# Patient Record
Sex: Female | Born: 1950 | Hispanic: No | Marital: Married | Smoking: Never smoker
Health system: Southern US, Community
[De-identification: ages and names within clinical notes are randomized; demographics above are authoritative.]

## PROBLEM LIST (undated history)

## (undated) DIAGNOSIS — F329 Major depressive disorder, single episode, unspecified: Secondary | ICD-10-CM

## (undated) DIAGNOSIS — E039 Hypothyroidism, unspecified: Secondary | ICD-10-CM

## (undated) DIAGNOSIS — E079 Disorder of thyroid, unspecified: Secondary | ICD-10-CM

## (undated) DIAGNOSIS — F32A Depression, unspecified: Secondary | ICD-10-CM

## (undated) DIAGNOSIS — I10 Essential (primary) hypertension: Secondary | ICD-10-CM

## (undated) HISTORY — PX: TUBAL LIGATION: SHX77

---

## 2017-03-22 ENCOUNTER — Other Ambulatory Visit: Payer: Self-pay | Admitting: Family Medicine

## 2017-03-22 DIAGNOSIS — R1032 Left lower quadrant pain: Secondary | ICD-10-CM

## 2017-03-31 ENCOUNTER — Encounter (HOSPITAL_COMMUNITY): Payer: Self-pay | Admitting: Emergency Medicine

## 2017-03-31 ENCOUNTER — Emergency Department (HOSPITAL_COMMUNITY): Payer: BLUE CROSS/BLUE SHIELD

## 2017-03-31 ENCOUNTER — Inpatient Hospital Stay (HOSPITAL_COMMUNITY)
Admission: EM | Admit: 2017-03-31 | Discharge: 2017-04-03 | DRG: 392 | Disposition: A | Discharge status: Home / Self Care | Payer: BLUE CROSS/BLUE SHIELD | Attending: General Surgery | Admitting: General Surgery

## 2017-03-31 DIAGNOSIS — Z882 Allergy status to sulfonamides status: Secondary | ICD-10-CM | POA: Diagnosis not present

## 2017-03-31 DIAGNOSIS — R1030 Lower abdominal pain, unspecified: Secondary | ICD-10-CM

## 2017-03-31 DIAGNOSIS — I10 Essential (primary) hypertension: Secondary | ICD-10-CM | POA: Diagnosis present

## 2017-03-31 DIAGNOSIS — N1330 Unspecified hydronephrosis: Secondary | ICD-10-CM | POA: Diagnosis present

## 2017-03-31 DIAGNOSIS — I700 Atherosclerosis of aorta: Secondary | ICD-10-CM | POA: Diagnosis present

## 2017-03-31 DIAGNOSIS — K651 Peritoneal abscess: Secondary | ICD-10-CM

## 2017-03-31 DIAGNOSIS — E876 Hypokalemia: Secondary | ICD-10-CM

## 2017-03-31 DIAGNOSIS — F329 Major depressive disorder, single episode, unspecified: Secondary | ICD-10-CM | POA: Diagnosis present

## 2017-03-31 DIAGNOSIS — E039 Hypothyroidism, unspecified: Secondary | ICD-10-CM | POA: Diagnosis present

## 2017-03-31 DIAGNOSIS — B955 Unspecified streptococcus as the cause of diseases classified elsewhere: Secondary | ICD-10-CM | POA: Diagnosis present

## 2017-03-31 DIAGNOSIS — K5720 Diverticulitis of large intestine with perforation and abscess without bleeding: Principal | ICD-10-CM | POA: Diagnosis present

## 2017-03-31 DIAGNOSIS — F32A Depression, unspecified: Secondary | ICD-10-CM

## 2017-03-31 HISTORY — DX: Essential (primary) hypertension: I10

## 2017-03-31 HISTORY — DX: Major depressive disorder, single episode, unspecified: F32.9

## 2017-03-31 HISTORY — DX: Hypothyroidism, unspecified: E03.9

## 2017-03-31 HISTORY — DX: Depression, unspecified: F32.A

## 2017-03-31 HISTORY — DX: Disorder of thyroid, unspecified: E07.9

## 2017-03-31 LAB — CBC
HCT: 34.7 % — ABNORMAL LOW (ref 36.0–46.0)
Hemoglobin: 11.3 g/dL — ABNORMAL LOW (ref 12.0–15.0)
MCH: 29.2 pg (ref 26.0–34.0)
MCHC: 32.6 g/dL (ref 30.0–36.0)
MCV: 89.7 fL (ref 78.0–100.0)
PLATELETS: 622 10*3/uL — AB (ref 150–400)
RBC: 3.87 MIL/uL (ref 3.87–5.11)
RDW: 13.6 % (ref 11.5–15.5)
WBC: 16.3 10*3/uL — ABNORMAL HIGH (ref 4.0–10.5)

## 2017-03-31 LAB — URINALYSIS, ROUTINE W REFLEX MICROSCOPIC
Bilirubin Urine: NEGATIVE
Glucose, UA: NEGATIVE mg/dL
Hgb urine dipstick: NEGATIVE
Ketones, ur: 5 mg/dL — AB
Leukocytes, UA: NEGATIVE
Nitrite: NEGATIVE
Protein, ur: 30 mg/dL — AB
Specific Gravity, Urine: 1.021 (ref 1.005–1.030)
pH: 5 (ref 5.0–8.0)

## 2017-03-31 LAB — COMPREHENSIVE METABOLIC PANEL
ALBUMIN: 3 g/dL — AB (ref 3.5–5.0)
ALT: 14 U/L (ref 14–54)
ANION GAP: 10 (ref 5–15)
AST: 18 U/L (ref 15–41)
Alkaline Phosphatase: 84 U/L (ref 38–126)
BUN: 10 mg/dL (ref 6–20)
CALCIUM: 8.8 mg/dL — AB (ref 8.9–10.3)
CO2: 23 mmol/L (ref 22–32)
Chloride: 103 mmol/L (ref 101–111)
Creatinine, Ser: 0.84 mg/dL (ref 0.44–1.00)
GFR calc Af Amer: >60 mL/min (ref >60)
GFR calc non Af Amer: >60 mL/min (ref >60)
GLUCOSE: 127 mg/dL — AB (ref 65–99)
Potassium: 3.2 mmol/L — ABNORMAL LOW (ref 3.5–5.1)
SODIUM: 136 mmol/L (ref 135–145)
TOTAL PROTEIN: 6.6 g/dL (ref 6.5–8.1)
Total Bilirubin: 0.7 mg/dL (ref 0.3–1.2)

## 2017-03-31 LAB — WET PREP, GENITAL
SPERM: NONE SEEN
TRICH WET PREP: NONE SEEN
Yeast Wet Prep HPF POC: NONE SEEN

## 2017-03-31 LAB — PROTIME-INR
INR: 1.21
Prothrombin Time: 15.4 seconds — ABNORMAL HIGH (ref 11.4–15.2)

## 2017-03-31 LAB — APTT: aPTT: 31 seconds (ref 24–36)

## 2017-03-31 LAB — I-STAT CG4 LACTIC ACID, ED: Lactic Acid, Venous: 1.36 mmol/L (ref 0.5–1.9)

## 2017-03-31 LAB — LIPASE, BLOOD: Lipase: 30 U/L (ref 11–51)

## 2017-03-31 MED ORDER — PIPERACILLIN-TAZOBACTAM 3.375 G IVPB
3.3750 g | Freq: Three times a day (TID) | INTRAVENOUS | Status: DC
  Administered 2017-03-31 – 2017-04-03 (×8): 3.375 g via INTRAVENOUS
  Filled 2017-03-31 (×10): qty 50

## 2017-03-31 MED ORDER — PIPERACILLIN-TAZOBACTAM 3.375 G IVPB 30 MIN
3.3750 g | Freq: Once | INTRAVENOUS | Status: AC
  Administered 2017-03-31: 3.375 g via INTRAVENOUS
  Filled 2017-03-31: qty 50

## 2017-03-31 MED ORDER — LEVOTHYROXINE SODIUM 75 MCG PO TABS
75.0000 ug | ORAL_TABLET | Freq: Every day | ORAL | Status: DC
  Administered 2017-04-03: 75 ug via ORAL
  Filled 2017-03-31: qty 1

## 2017-03-31 MED ORDER — MORPHINE SULFATE (PF) 4 MG/ML IV SOLN
4.0000 mg | Freq: Once | INTRAVENOUS | Status: AC
  Administered 2017-03-31: 4 mg via INTRAVENOUS
  Filled 2017-03-31: qty 1

## 2017-03-31 MED ORDER — SODIUM CHLORIDE 0.9 % IV SOLN
INTRAVENOUS | Status: DC
  Administered 2017-03-31 – 2017-04-01 (×2): via INTRAVENOUS
  Administered 2017-04-01: 1 mL via INTRAVENOUS
  Administered 2017-04-01 – 2017-04-02 (×2): via INTRAVENOUS

## 2017-03-31 MED ORDER — SODIUM CHLORIDE 0.9 % IV BOLUS (SEPSIS)
1000.0000 mL | Freq: Once | INTRAVENOUS | Status: AC
  Administered 2017-03-31: 1000 mL via INTRAVENOUS

## 2017-03-31 MED ORDER — ZOLPIDEM TARTRATE 5 MG PO TABS
5.0000 mg | ORAL_TABLET | Freq: Every evening | ORAL | Status: DC | PRN
  Filled 2017-03-31: qty 1

## 2017-03-31 MED ORDER — ENOXAPARIN SODIUM 40 MG/0.4ML ~~LOC~~ SOLN
40.0000 mg | SUBCUTANEOUS | Status: DC
  Administered 2017-04-01 – 2017-04-02 (×2): 40 mg via SUBCUTANEOUS
  Filled 2017-03-31 (×2): qty 0.4

## 2017-03-31 MED ORDER — ONDANSETRON 4 MG PO TBDP: 4.0000 mg | ORAL_TABLET | Freq: Four times a day (QID) | ORAL | Status: DC | PRN

## 2017-03-31 MED ORDER — POTASSIUM CHLORIDE 10 MEQ/100ML IV SOLN
10.0000 meq | Freq: Once | INTRAVENOUS | Status: AC
  Administered 2017-03-31: 10 meq via INTRAVENOUS
  Filled 2017-03-31: qty 100

## 2017-03-31 MED ORDER — MORPHINE SULFATE (PF) 4 MG/ML IV SOLN: 2.0000 mg | INTRAVENOUS | Status: DC | PRN

## 2017-03-31 MED ORDER — DIPHENHYDRAMINE HCL 12.5 MG/5ML PO ELIX
12.5000 mg | ORAL_SOLUTION | Freq: Four times a day (QID) | ORAL | Status: DC | PRN
  Filled 2017-03-31: qty 10

## 2017-03-31 MED ORDER — DIPHENHYDRAMINE HCL 50 MG/ML IJ SOLN
12.5000 mg | Freq: Four times a day (QID) | INTRAMUSCULAR | Status: DC | PRN
  Filled 2017-03-31: qty 1

## 2017-03-31 MED ORDER — ONDANSETRON HCL 4 MG/2ML IJ SOLN
4.0000 mg | Freq: Once | INTRAMUSCULAR | Status: AC
  Administered 2017-03-31: 4 mg via INTRAVENOUS
  Filled 2017-03-31: qty 2

## 2017-03-31 MED ORDER — SERTRALINE HCL 50 MG PO TABS
50.0000 mg | ORAL_TABLET | Freq: Every day | ORAL | Status: DC
  Administered 2017-04-02 – 2017-04-03 (×2): 50 mg via ORAL
  Filled 2017-03-31 (×2): qty 1

## 2017-03-31 MED ORDER — IOPAMIDOL (ISOVUE-300) INJECTION 61%
INTRAVENOUS | Status: AC
  Administered 2017-03-31: 100 mL
  Filled 2017-03-31: qty 100

## 2017-03-31 MED ORDER — ONDANSETRON HCL 4 MG/2ML IJ SOLN: 4.0000 mg | Freq: Four times a day (QID) | INTRAMUSCULAR | Status: DC | PRN

## 2017-03-31 MED ORDER — LOSARTAN POTASSIUM 50 MG PO TABS
100.0000 mg | ORAL_TABLET | Freq: Every day | ORAL | Status: DC
  Administered 2017-04-02 – 2017-04-03 (×2): 100 mg via ORAL
  Filled 2017-03-31 (×2): qty 2

## 2017-03-31 MED ORDER — ACETAMINOPHEN 325 MG PO TABS
650.0000 mg | ORAL_TABLET | ORAL | Status: DC | PRN
  Administered 2017-03-31: 650 mg via ORAL
  Filled 2017-03-31: qty 2

## 2017-03-31 NOTE — ED Provider Notes (Signed)
[facility]-EMERGENCY DEPT Provider Note   CSN: [id number] Arrival date & time: 03/31/17  0800     History   Chief Complaint Chief Complaint  Patient presents with  . Pelvic Pain  . Abdominal Pain    HPI Stephanie Ho is a 66 y.o. female.  HPI   Stephanie Ho is a 66 y.o. female, with a history of HTN and hypothyroidism, presenting to the ED with lower abdominal pain for the last week. Pain is bilateral, constantly present, waxing and waning from 6/10 to 10/10, dull/aching that will increase to a sharp pain, radiates through to the lower back. She has tried moving to a liquid diet and clear fluids without improvement.   Had blood work done last week with results delivered to her on Aug 14. Was clinically diagnosed with diverticulitis and placed on Cipro and Flagyl. She endorses fatigue, abdominal distension, and a fever this week with Tmax 101.4 as well as diarrhea and nausea, but the diarrhea only began after she was placed on the ABX. Stools are dark.   Started with a sudden pain in the left lower quadrant four weeks ago. Went to an UC at that time and was treated for an UTI. No other recent ABX use.   Denies hematochezia, abnormal vaginal discharge, vaginal bleeding, dysuria, hematuria, or any other complaints.   Patient adds that her blood pressure usually runs around 120 systolic. Last food intake was around 5pm last night.   Past Medical History:  Diagnosis Date  . Depression   . Hypertension   . Hypothyroidism   . Thyroid disease     There are no active problems to display for this patient.   Past Surgical History:  Procedure Laterality Date  . TUBAL LIGATION      OB History    No data available       Home Medications    Prior to Admission medications   Medication Sig Start Date End Date Taking? Authorizing Provider  levothyroxine (SYNTHROID, LEVOTHROID) 75 MCG tablet Take 75 mcg by mouth daily before breakfast.   Yes [provider]  losartan  (COZAAR) 100 MG tablet Take 100 mg by mouth daily.   Yes [provider]  sertraline (ZOLOFT) 50 MG tablet Take 50 mg by mouth daily.   Yes [provider]    Family History No family history on file.  Social History Social History  Substance Use Topics  . Smoking status: Not on file  . Smokeless tobacco: Not on file  . Alcohol use Not on file     Allergies   Sulfa antibiotics   Review of Systems Review of Systems  Constitutional: Positive for appetite change, chills, fatigue and fever.  Respiratory: Negative for shortness of breath.   Cardiovascular: Negative for chest pain and leg swelling.  Gastrointestinal: Positive for abdominal distention, abdominal pain, diarrhea and nausea. Negative for vomiting.  Genitourinary: Negative for difficulty urinating, dysuria and hematuria.  Musculoskeletal: Positive for back pain.  All other systems reviewed and are negative.    Physical Exam Updated Vital Signs BP 97/61   Pulse 70   Temp 99 F (37.2 C) (Oral)   Resp 18   Ht 5\' 4"  (1.626 m)   Wt 63.5 kg (140 lb)   SpO2 100%   BMI 24.03 kg/m   Physical Exam  Constitutional: She appears well-developed and well-nourished. No distress.  HENT:  Head: Normocephalic and atraumatic.  Eyes: Conjunctivae are normal.  Neck: Neck supple.  Cardiovascular: Normal  rate, regular rhythm, normal heart sounds and intact distal pulses.   Pulmonary/Chest: Effort normal and breath sounds normal. No respiratory distress.  Abdominal: Soft. Bowel sounds are increased. There is tenderness in the right lower quadrant, suprapubic area and left lower quadrant. There is no guarding and no CVA tenderness.  No peritoneal signs.  Genitourinary:  Genitourinary Comments: External genitalia normal Vagina without discharge Cervix  abnormal  - Single, approximately 0.25 cm lesion with the appearance of possibly a polyp at the 4:00 position. No other lesions noted. Cervix is not  friable. negative for cervical motion tenderness Adnexa palpated, no masses, positive for tenderness noted on the left Bladder palpated positive for tenderness Uterus palpated no masses, positive for tenderness  Otherwise normal female genitalia. Med [place], [place], served as chaperone during exam.  No external hemorrhoids, fissures, or lesions noted. No gross blood or stool burden. No rectal tenderness. Med Tech, Elmarie Shiley, served as chaperone during the rectal exam.   Musculoskeletal: She exhibits no edema.  Lymphadenopathy:    She has no cervical adenopathy.  Neurological: She is alert.  Skin: Skin is warm and dry. She is not diaphoretic.  Psychiatric: She has a normal mood and affect. Her behavior is normal.  Nursing note and vitals reviewed.    ED Treatments / Results  Labs (all labs ordered are listed, but only abnormal results are displayed) Labs Reviewed  WET PREP, GENITAL - Abnormal; Notable for the following:       Result Value   Clue Cells Wet Prep HPF POC PRESENT (*)    WBC, Wet Prep HPF POC FEW (*)    All other components within normal limits  COMPREHENSIVE METABOLIC PANEL - Abnormal; Notable for the following:    Potassium 3.2 (*)    Glucose, Bld 127 (*)    Calcium 8.8 (*)    Albumin 3.0 (*)    All other components within normal limits  CBC - Abnormal; Notable for the following:    WBC 16.3 (*)    Hemoglobin 11.3 (*)    HCT 34.7 (*)    Platelets 622 (*)    All other components within normal limits  URINALYSIS, ROUTINE W REFLEX MICROSCOPIC - Abnormal; Notable for the following:    Color, Urine AMBER (*)    APPearance HAZY (*)    Ketones, ur 5 (*)    Protein, ur 30 (*)    Bacteria, UA RARE (*)    Squamous Epithelial / LPF 0-5 (*)    All other components within normal limits  CULTURE, BLOOD (ROUTINE X 2)  CULTURE, BLOOD (ROUTINE X 2)  LIPASE, BLOOD  RPR  HIV ANTIBODY (ROUTINE TESTING)  I-STAT CG4 LACTIC ACID, ED  POC OCCULT BLOOD, ED  GC/CHLAMYDIA PROBE  AMP ([place]) NOT AT [facility]    EKG  EKG Interpretation None       Radiology Ct Abdomen Pelvis W Contrast  Result Date: 03/31/2017 CLINICAL DATA:  Low abdominal pain for 2 weeks. Clinical suspicion for diverticulitis. EXAM: CT ABDOMEN AND PELVIS WITH CONTRAST TECHNIQUE: Multidetector CT imaging of the abdomen and pelvis was performed using the standard protocol following bolus administration of intravenous contrast. CONTRAST:  ISOVUE-300 IOPAMIDOL (ISOVUE-300) INJECTION 61% COMPARISON:  None. FINDINGS: Lower chest:  Unremarkable. Hepatobiliary: 1.7 cm low-density lesion posterior right liver has attenuation higher than would be expected for a simple cyst. Liver parenchyma otherwise unremarkable. There is no evidence for gallstones, gallbladder wall thickening, or pericholecystic fluid. No intrahepatic or extrahepatic biliary dilation. Pancreas:  No focal mass lesion. No dilatation of the main duct. No intraparenchymal cyst. No peripancreatic edema. Spleen: No splenomegaly. No focal mass lesion. Adrenals/Urinary Tract: No adrenal nodule or mass. Right kidney unremarkable with prominent extrarenal pelvis. Right ureter within normal limits. The left kidney shows mild hydronephrosis and decreased perfusion compatible with obstructive uropathy. Source of urinary obstruction is the left pelvic sidewall inflammatory process (see below). Stomach/Bowel: Stomach is nondistended. No gastric wall thickening. No evidence of outlet obstruction. Duodenum is normally positioned as is the ligament of Treitz. No small bowel wall thickening. No small bowel dilatation. The terminal ileum is normal. The appendix is normal. No colonic dilatation. The sigmoid colon shows irregular rim wall thickening and there is a large para colonic collection of gas and air tracking up along the left pelvic sidewall. This measures 5.0 x 12.6 x 7.1 cm and is compatible with a a large para colonic abscess, likely of diverticular origin.  This abscess is the cause of the left urinary obstruction and generates mass-effect on the left external iliac vessels, but the left external iliac and common iliac veins do appear to remain patent. Vascular/Lymphatic: There is abdominal aortic atherosclerosis without aneurysm. There is no gastrohepatic or hepatoduodenal ligament lymphadenopathy. No intraperitoneal or retroperitoneal lymphadenopathy. No pelvic sidewall lymphadenopathy. Reproductive: The uterus has normal CT imaging appearance. No right adnexal mass. Left ovary appears to be incorporated in the inflammatory process along the left pelvic sidewall. Other: Trace intraperitoneal free fluid evident. Musculoskeletal: Bone windows reveal no worrisome lytic or sclerotic osseous lesions. IMPRESSION: 1. 5 x 13 x 7 cm abscess along the left pelvic sidewall appears to be secondary to sigmoid colon diverticulitis. Left ovary may be incorporated into the inflammatory process. 2. Left obstructive uropathy. The level of obstruction is at the may be ureter and secondary to the abscess along the left pelvic sidewall. 3. Insert athero Electronically Signed   By: Kennith Center M.D.   On: 03/31/2017 12:51    Procedures Pelvic exam Date/Time: 03/31/2017 11:52 AM Performed by: Anselm Pancoast Authorized by: Harolyn Rutherford C  Consent: Verbal consent obtained. Risks and benefits: risks, benefits and alternatives were discussed Consent given by: patient Patient understanding: patient states understanding of the procedure being performed Patient consent: the patient's understanding of the procedure matches consent given Procedure consent: procedure consent matches procedure scheduled Patient identity confirmed: verbally with patient and arm band Local anesthesia used: no  Anesthesia: Local anesthesia used: no  Sedation: Patient sedated: no Patient tolerance: Patient tolerated the procedure well with no immediate complications    (including critical care  time)  Medications Ordered in ED Medications  iopamidol (ISOVUE-300) 61 % injection (100 mLs  Contrast Given 03/31/17 1046)  sodium chloride 0.9 % bolus 1,000 mL (0 mLs Intravenous Stopped 03/31/17 1258)    Followed by  sodium chloride 0.9 % bolus 1,000 mL (0 mLs Intravenous Stopped 03/31/17 1258)  potassium chloride 10 mEq in 100 mL IVPB (0 mEq Intravenous Stopped 03/31/17 1224)  ondansetron (ZOFRAN) injection 4 mg (4 mg Intravenous Given 03/31/17 1119)  morphine 4 MG/ML injection 4 mg (4 mg Intravenous Given 03/31/17 1119)  piperacillin-tazobactam (ZOSYN) IVPB 3.375 g (3.375 g Intravenous New Bag/Given 03/31/17 1345)  morphine 4 MG/ML injection 4 mg (4 mg Intravenous Given 03/31/17 1345)     Initial Impression / Assessment and Plan / ED Course  I have reviewed the triage vital signs and the nursing notes.  Pertinent labs & imaging results that were available during my care  of the patient were reviewed by me and considered in my medical decision making (see chart for details).  Clinical Course as of Mar 31 1416  Fri Mar 31, 2017  1152 Patient voices improvement in her pain. States it is dull and rates it 3/10. Pelvic and rectal exams performed. Tiffany as Biomedical engineer.  [SJ]  1305 Discussed CT results with patient. Patient agrees to the plan. States her pain has recurred. Denies additional complaints.  [SJ]  1320 Spoke with Bailey Mech, PA with General Surgery, who states someone from their team will come see the patient. Requests Zosyn.   [SJ]  1405 Dr. Corliss Skains to evaluate patient. States general surgery service will admit.   [SJ]    Clinical Course User Index [SJ] Carlie Corpus C, PA-C    Patient presents with lower abdominal pain. Previously treated with oral medications for diverticulitis. Patient is nontoxic appearing, afebrile, not tachycardic, not tachypneic,  maintains SPO2 of 99-100% on room air. Colonic abscess noted on CT. ABX therapy initiated. General surgery service to admit. Hemoccult  pending.  Patient was notified of finding of possible cervical polyp and advised to follow up with OB/GYN.  Findings and plan of care discussed with Frederick Peers, MD. Dr. Clarene Duke personally evaluated and examined this patient.  Vitals:   03/31/17 1215 03/31/17 1245 03/31/17 1315 03/31/17 1330  BP: (!) 105/46 108/60 (!) 110/58 121/60  Pulse: 72 70 73 68  Resp: 18 17 18 19   Temp:      TempSrc:      SpO2: 100% 100% 99% 100%  Weight:      Height:         Final Clinical Impressions(s) / ED Diagnoses   Final diagnoses:  Lower abdominal pain  Hypokalemia    New Prescriptions New Prescriptions   No medications on file     Concepcion Living 03/31/17 1417    Little, Ambrose Finland, MD 04/04/17 1215

## 2017-03-31 NOTE — H&P (Signed)
Stephanie Ho is an 66 y.o. female.   Chief Complaint: Lower abdominal pressure and pain HPI: this is a 66 year old female in good health who presents with a one-month history of Left lower quadrant abdominal pain. Over the last several days this has migrated to the lower mid abdomen and suprapubic region and has become more severe. This pain radiates through to her back. She describes a lot of pressure in her lower abdomen.her symptoms began about 4 weeks ago. She had been constipated for a few days after recent change in her antidepression medications. She was eating when she felt a sudden onset of left lower quadrant pain. This was very ere. The pain abated somewhat and she did not seek any medical care at that time. However over the last several weeks her pain has worsened. She was seen at an urgent care earlier this week and had a temperature 11.4. She was diagnosed with diverticulitis based on clinical examination and was given prescriptions for Cipro and Flagyl. However symptoms have worsened. She has also noticed some more difficulty with urination.  She presented to the emergency department today for evaluation. She was noted to have what appears to be sigmoid diverticulitis with a very large abscess with compression of the left ureter.  There is mild left hydronephrosis.    Past Medical History:  Diagnosis Date  . Depression   . Hypertension   . Hypothyroidism   . Thyroid disease     Past Surgical History:  Procedure Laterality Date  . TUBAL LIGATION      No family history on file. Social History:  has no tobacco, alcohol, and drug history on file.  Allergies:  Allergies  Allergen Reactions  . Sulfa Antibiotics Other (See Comments)    Coma    Prior to Admission medications   Medication Sig Start Date End Date Taking? Authorizing Provider  levothyroxine (SYNTHROID, LEVOTHROID) 75 MCG tablet Take 75 mcg by mouth daily before breakfast.   Yes [provider]  losartan  (COZAAR) 100 MG tablet Take 100 mg by mouth daily.   Yes [provider]  sertraline (ZOLOFT) 50 MG tablet Take 50 mg by mouth daily.   Yes [provider]     Results for orders placed or performed during the hospital encounter of 03/31/17 (from the past 48 hour(s))  Lipase, blood     Status: None   Collection Time: 03/31/17  8:23 AM  Result Value Ref Range   Lipase 30 11 - 51 U/L  Comprehensive metabolic panel     Status: Abnormal   Collection Time: 03/31/17  8:23 AM  Result Value Ref Range   Sodium 136 135 - 145 mmol/L   Potassium 3.2 (L) 3.5 - 5.1 mmol/L   Chloride 103 101 - 111 mmol/L   CO2 23 22 - 32 mmol/L   Glucose, Bld 127 (H) 65 - 99 mg/dL   BUN 10 6 - 20 mg/dL   Creatinine, Ser 0.84 0.44 - 1.00 mg/dL   Calcium 8.8 (L) 8.9 - 10.3 mg/dL   Total Protein 6.6 6.5 - 8.1 g/dL   Albumin 3.0 (L) 3.5 - 5.0 g/dL   AST 18 15 - 41 U/L   ALT 14 14 - 54 U/L   Alkaline Phosphatase 84 38 - 126 U/L   Total Bilirubin 0.7 0.3 - 1.2 mg/dL   GFR calc non Af Amer >60 >60 mL/min   GFR calc Af Amer >60 >60 mL/min    Comment: (NOTE) The eGFR  has been calculated using the CKD EPI equation. This calculation has not been validated in all clinical situations. eGFR's persistently <60 mL/min signify possible Chronic Kidney Disease.    Anion gap 10 5 - 15  CBC     Status: Abnormal   Collection Time: 03/31/17  8:23 AM  Result Value Ref Range   WBC 16.3 (H) 4.0 - 10.5 K/uL   RBC 3.87 3.87 - 5.11 MIL/uL   Hemoglobin 11.3 (L) 12.0 - 15.0 g/dL   HCT 34.7 (L) 36.0 - 46.0 %   MCV 89.7 78.0 - 100.0 fL   MCH 29.2 26.0 - 34.0 pg   MCHC 32.6 30.0 - 36.0 g/dL   RDW 13.6 11.5 - 15.5 %   Platelets 622 (H) 150 - 400 K/uL  Urinalysis, Routine w reflex microscopic     Status: Abnormal   Collection Time: 03/31/17  8:30 AM  Result Value Ref Range   Color, Urine AMBER (A) YELLOW    Comment: BIOCHEMICALS MAY BE AFFECTED BY COLOR   APPearance HAZY (A) CLEAR   Specific Gravity, Urine 1.021  1.005 - 1.030   pH 5.0 5.0 - 8.0   Glucose, UA NEGATIVE NEGATIVE mg/dL   Hgb urine dipstick NEGATIVE NEGATIVE   Bilirubin Urine NEGATIVE NEGATIVE   Ketones, ur 5 (A) NEGATIVE mg/dL   Protein, ur 30 (A) NEGATIVE mg/dL   Nitrite NEGATIVE NEGATIVE   Leukocytes, UA NEGATIVE NEGATIVE   RBC / HPF 6-30 0 - 5 RBC/hpf   WBC, UA 0-5 0 - 5 WBC/hpf   Bacteria, UA RARE (A) NONE SEEN   Squamous Epithelial / LPF 0-5 (A) NONE SEEN   Mucous PRESENT   I-Stat CG4 Lactic Acid, ED     Status: None   Collection Time: 03/31/17 11:20 AM  Result Value Ref Range   Lactic Acid, Venous 1.36 0.5 - 1.9 mmol/L  Wet prep, genital     Status: Abnormal   Collection Time: 03/31/17 11:50 AM  Result Value Ref Range   Yeast Wet Prep HPF POC NONE SEEN NONE SEEN   Trich, Wet Prep NONE SEEN NONE SEEN   Clue Cells Wet Prep HPF POC PRESENT (A) NONE SEEN   WBC, Wet Prep HPF POC FEW (A) NONE SEEN   Sperm NONE SEEN    Ct Abdomen Pelvis W Contrast  Result Date: 03/31/2017 CLINICAL DATA:  Low abdominal pain for 2 weeks. Clinical suspicion for diverticulitis. EXAM: CT ABDOMEN AND PELVIS WITH CONTRAST TECHNIQUE: Multidetector CT imaging of the abdomen and pelvis was performed using the standard protocol following bolus administration of intravenous contrast. CONTRAST:  160m ISOVUE-300 IOPAMIDOL (ISOVUE-300) INJECTION 61% COMPARISON:  None. FINDINGS: Lower chest:  Unremarkable. Hepatobiliary: 1.7 cm low-density lesion posterior right liver has attenuation higher than would be expected for a simple cyst. Liver parenchyma otherwise unremarkable. There is no evidence for gallstones, gallbladder wall thickening, or pericholecystic fluid. No intrahepatic or extrahepatic biliary dilation. Pancreas: No focal mass lesion. No dilatation of the main duct. No intraparenchymal cyst. No peripancreatic edema. Spleen: No splenomegaly. No focal mass lesion. Adrenals/Urinary Tract: No adrenal nodule or mass. Right kidney unremarkable with prominent  extrarenal pelvis. Right ureter within normal limits. The left kidney shows mild hydronephrosis and decreased perfusion compatible with obstructive uropathy. Source of urinary obstruction is the left pelvic sidewall inflammatory process (see below). Stomach/Bowel: Stomach is nondistended. No gastric wall thickening. No evidence of outlet obstruction. Duodenum is normally positioned as is the ligament of Treitz. No small bowel wall thickening. No  small bowel dilatation. The terminal ileum is normal. The appendix is normal. No colonic dilatation. The sigmoid colon shows irregular rim wall thickening and there is a large para colonic collection of gas and air tracking up along the left pelvic sidewall. This measures 5.0 x 12.6 x 7.1 cm and is compatible with a a large para colonic abscess, likely of diverticular origin. This abscess is the cause of the left urinary obstruction and generates mass-effect on the left external iliac vessels, but the left external iliac and common iliac veins do appear to remain patent. Vascular/Lymphatic: There is abdominal aortic atherosclerosis without aneurysm. There is no gastrohepatic or hepatoduodenal ligament lymphadenopathy. No intraperitoneal or retroperitoneal lymphadenopathy. No pelvic sidewall lymphadenopathy. Reproductive: The uterus has normal CT imaging appearance. No right adnexal mass. Left ovary appears to be incorporated in the inflammatory process along the left pelvic sidewall. Other: Trace intraperitoneal free fluid evident. Musculoskeletal: Bone windows reveal no worrisome lytic or sclerotic osseous lesions. IMPRESSION: 1. 5 x 13 x 7 cm abscess along the left pelvic sidewall appears to be secondary to sigmoid colon diverticulitis. Left ovary may be incorporated into the inflammatory process. 2. Left obstructive uropathy. The level of obstruction is at the may be ureter and secondary to the abscess along the left pelvic sidewall. 3. Insert athero Electronically Signed    By: Misty Stanley M.D.   On: 03/31/2017 12:51    Review of Systems  Constitutional: Negative for weight loss.  HENT: Negative for ear discharge, ear pain, hearing loss and tinnitus.   Eyes: Negative for blurred vision, double vision, photophobia and pain.  Respiratory: Negative for cough, sputum production and shortness of breath.   Cardiovascular: Negative for chest pain.  Gastrointestinal: Positive for abdominal pain and constipation. Negative for nausea and vomiting.  Genitourinary: Negative for dysuria, flank pain, frequency and urgency.  Musculoskeletal: Negative for back pain, falls, joint pain, myalgias and neck pain.  Neurological: Negative for dizziness, tingling, sensory change, focal weakness, loss of consciousness and headaches.  Endo/Heme/Allergies: Does not bruise/bleed easily.  Psychiatric/Behavioral: Positive for depression. Negative for memory loss and substance abuse. The patient is not nervous/anxious.     Blood pressure 121/60, pulse 68, temperature 99 F (37.2 C), temperature source Oral, resp. rate 19, height _0  (1.626 m), weight 63.5 kg (140 lb), SpO2 100 %. Physical Exam  WDWN in NAD Eyes:  Pupils equal, round; sclera anicteric HENT:  Oral mucosa moist; good dentition  Neck:  No masses palpated, no thyromegaly Lungs:  CTA bilaterally; normal respiratory effort CV:  Regular rate and rhythm; no murmurs; extremities well-perfused with no edema Abd:  +bowel sounds, soft, tender in lower mid-abdomen and suprapubic region; mild guarding; no rebound tenderness; minimal LLQ tenderness Skin:  Warm, dry; no sign of jaundice Psychiatric - alert and oriented x 4; calm mood and affect  Assessment/Plan Sigmoid diverticulitis with large pericolonic diverticular abscess  Plan:  Admit for IV hydration/ antibiotics Interventional Radiology to evaluate for percutaneous drainage. We discussed the possibility if drainage is unsuccessful that she might need urgent surgery  with sigmoid colectomy and possible colostomy.  Her situations is complicated by the fact that she has been her husband are moving to [place] in the next couple of weeks.  She understands her situation and is in agreement with this plan.  Maia Petties., MD 03/31/2017, 2:20 PM

## 2017-03-31 NOTE — ED Triage Notes (Signed)
Pt c/o abdominal pain x3 weeks, intially diagnosed with UTI, then possible diverticulis, pt given 500 mg cipro, pt still c/o ongoing lower abdominal pain.

## 2017-03-31 NOTE — Progress Notes (Signed)
IR aware of request for drain.  She has had appropriate labs etc.  She will be NPO p MN and we will plan for drain placement sometime tomorrow.  Full consult to follow in the morning.  Rayshaun Needle E 4:30 PM 03/31/2017

## 2017-04-01 ENCOUNTER — Encounter (HOSPITAL_COMMUNITY): Payer: Self-pay | Admitting: General Surgery

## 2017-04-01 ENCOUNTER — Inpatient Hospital Stay (HOSPITAL_COMMUNITY): Payer: BLUE CROSS/BLUE SHIELD

## 2017-04-01 LAB — HIV ANTIBODY (ROUTINE TESTING W REFLEX): HIV Screen 4th Generation wRfx: NONREACTIVE

## 2017-04-01 IMAGING — CT CT IMAGE GUIDED DRAINAGE BY PERCUTANEOUS CATHETER
1 of 3 series · 12 of 32 positions shown, 18 images · non-contrast
Comparison: none

INDICATION: 66-year-old female with a history of diverticular abscess peer
TECHNIQUE: Informed written consent was obtained from the patient after a
thorough discussion of the procedural risks, benefits and
alternatives. All questions were addressed. Maximal Sterile Barrier
Technique was utilized including caps, mask, sterile gowns, sterile
gloves, sterile drape, hand hygiene and skin antiseptic. A timeout
was performed prior to the initiation of the procedure.

[Series 2: i-spiral 5.0 b40f · axial · 0.76mm/px · z∈[+1012,+1142]mm · 12 of 45 slices shown, 18 images]
[im 4/45  soft-tissue]
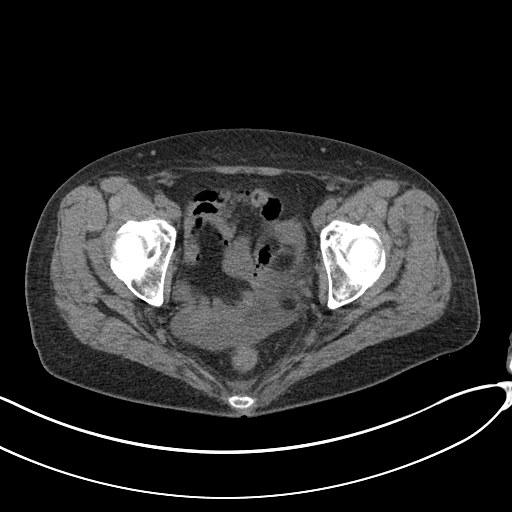
[im 4/45  bone]
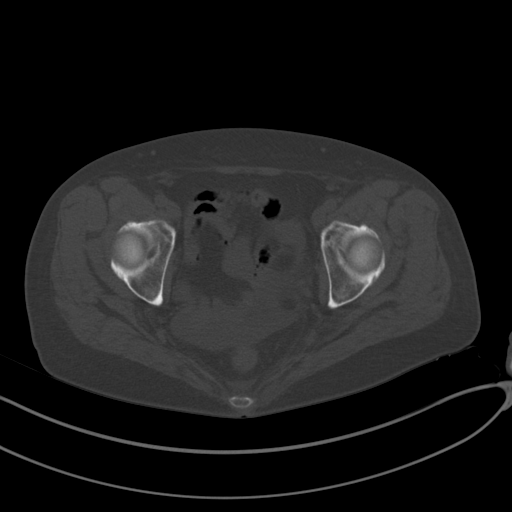
[im 7/45  soft-tissue]
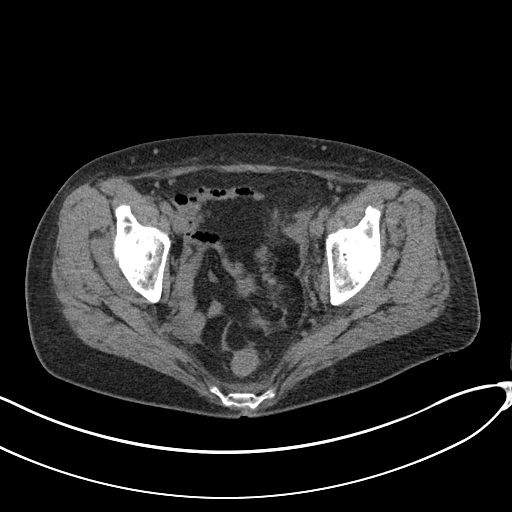
[im 10/45  soft-tissue]
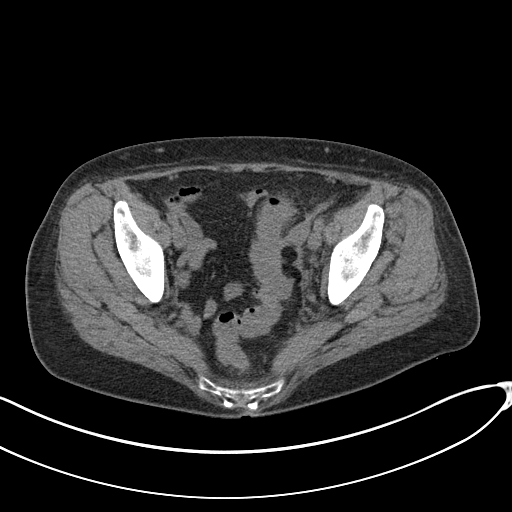
[im 13/45  soft-tissue]
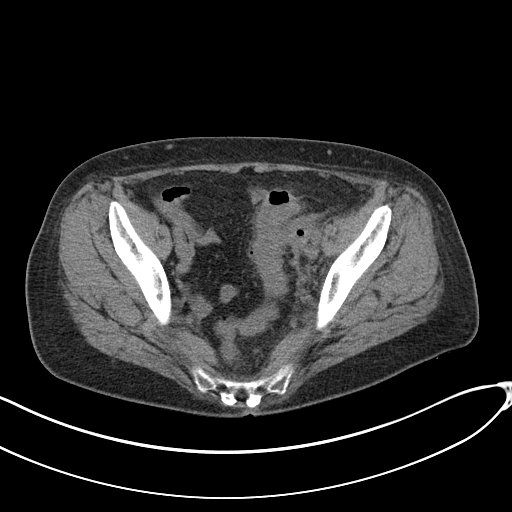
[im 16/45  soft-tissue]
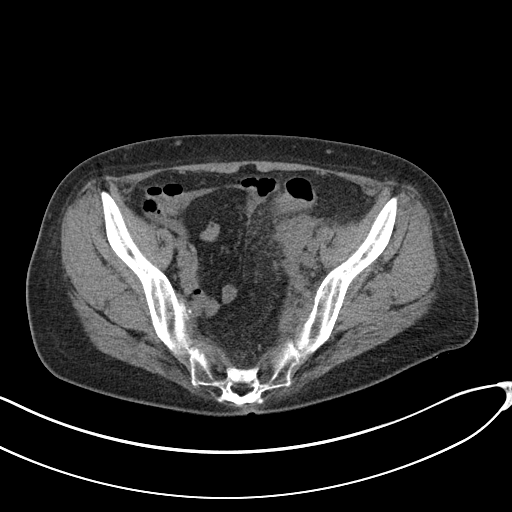
[im 19/45  soft-tissue]
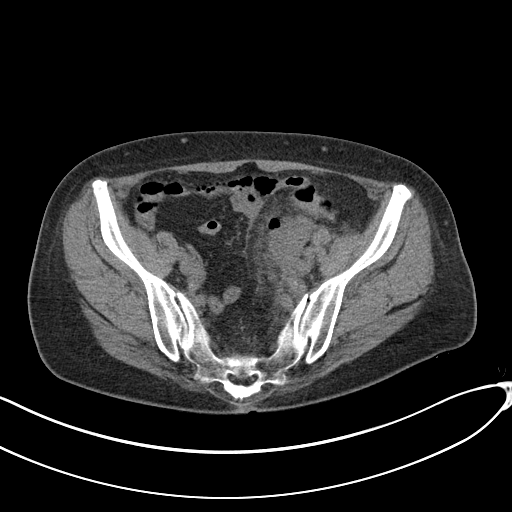
[im 26/45  soft-tissue]
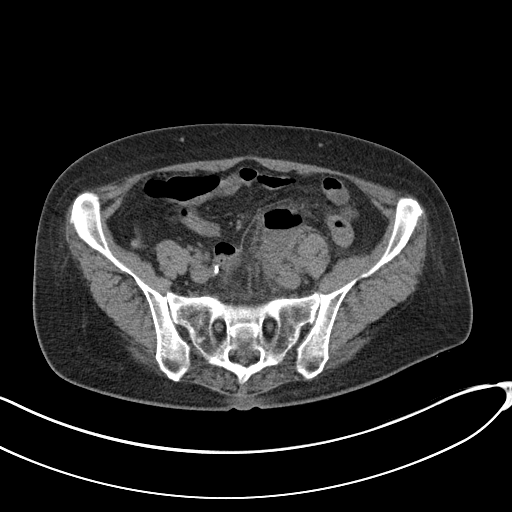
[im 29/45  soft-tissue]
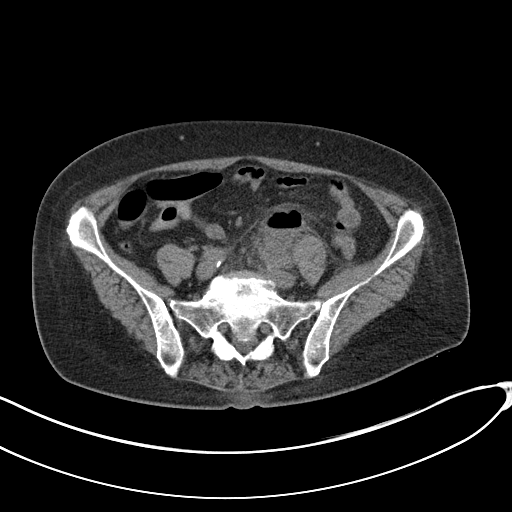
[im 32/45  soft-tissue]
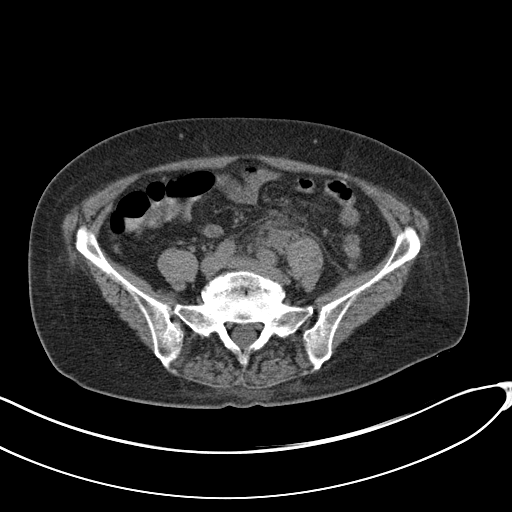
[im 32/45  lung]
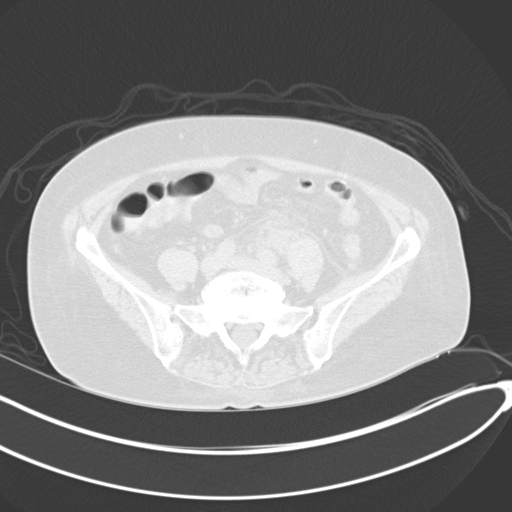
[im 32/45  bone]
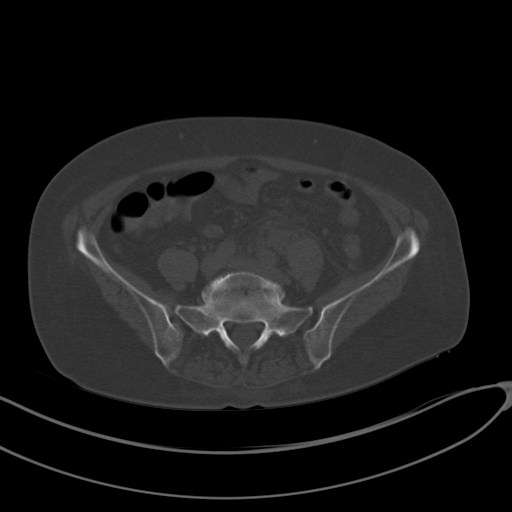
[im 35/45  soft-tissue]
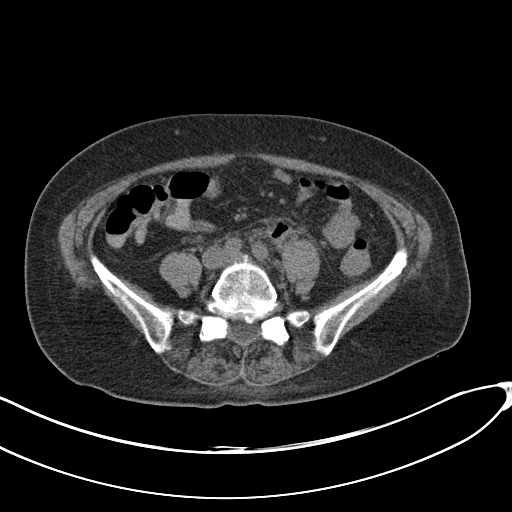
[im 35/45  lung]
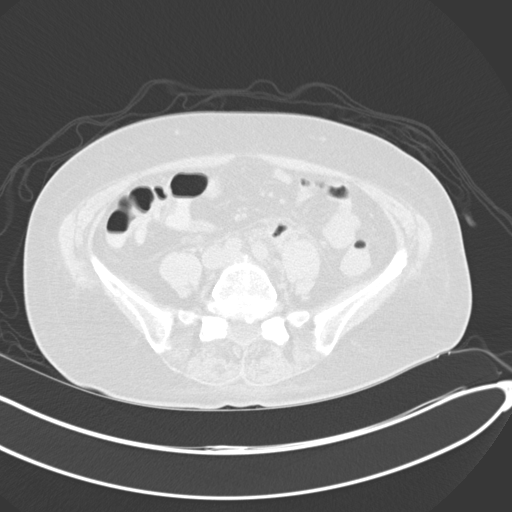
[im 38/45  soft-tissue]
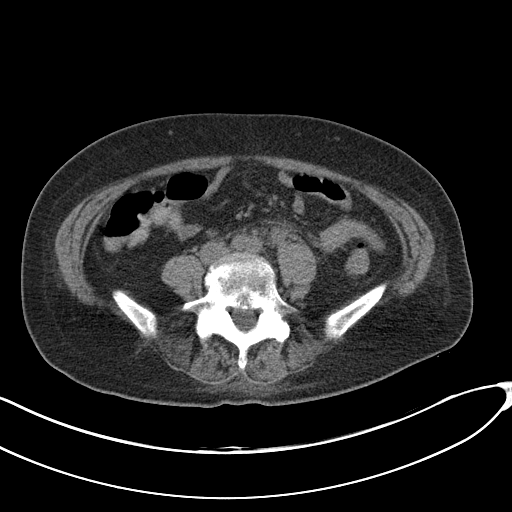
[im 38/45  lung]
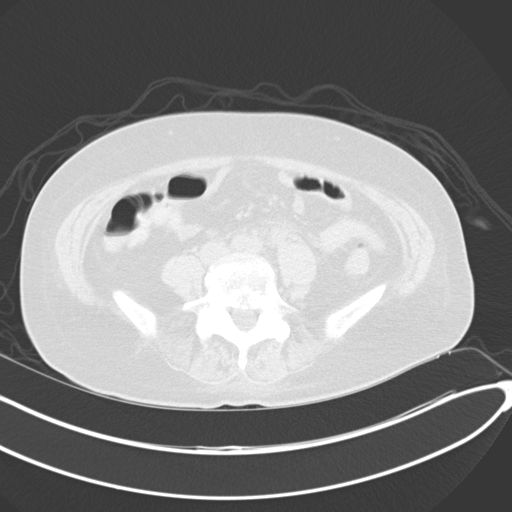
[im 41/45  soft-tissue]
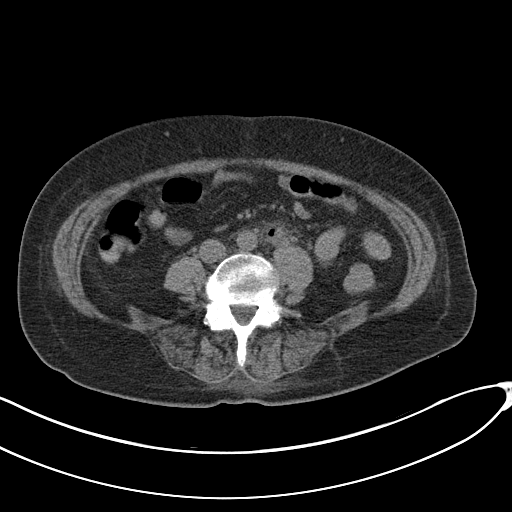
[im 41/45  lung]
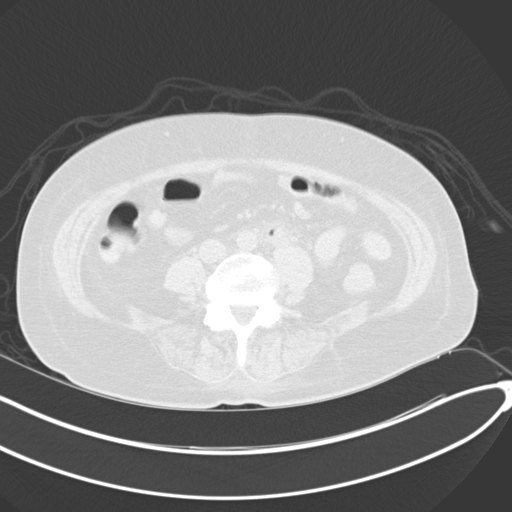

[12 of 32 positions shown; findings below may reference images not displayed]

EXAM:
CT GUIDED DRAINAGE OF  ABSCESS

MEDICATIONS:
The patient is currently admitted to the hospital and receiving
intravenous antibiotics. The antibiotics were administered within an
appropriate time frame prior to the initiation of the procedure.

ANESTHESIA/SEDATION:
3.0 mg IV Versed 75 mcg IV Fentanyl

Moderate Sedation Time:  14 minutes

The patient was continuously monitored during the procedure by the
interventional radiology nurse under my direct supervision.

COMPLICATIONS:
None
PROCEDURE:
The operative field was prepped with chlorhexidine in a sterile
fashion, and a sterile drape was applied covering the operative
field. A sterile gown and sterile gloves were used for the
procedure. Local anesthesia was provided with 1% Lidocaine.

Once the patient is prepped and draped in the usual sterile fashion,
the skin and subcutaneous tissues of the anterior abdominal wall
were generously infiltrated with 1% lidocaine for local anesthesia.
Using CT guidance, 18 gauge trocar needle was advanced into the
abscess within the left pelvis. Once we confirmed the needle tip was
in position with CT guidance and with aspiration of purulent
material, modified Seldinger technique was used to place a 10 French
drainage catheter into the abscess.

Approximately 20- 30 cc of purulent material aspirated for culture.

Catheter was sutured to the anterior abdominal wall and a final
image was stored.

Catheter was attached to gravity drainage.

Patient tolerated the procedure well and remained hemodynamically
stable throughout.

No complications were encountered and no significant blood loss.
FINDINGS: Initial CT imaged re- demonstrates gas and fluid collection in the
left pelvis compatible with diverticular abscess.

Status post drainage catheter placement there is collapse of the
fluid collection.
IMPRESSION: Status post CT-guided drainage of left pelvic abscess. Sample was
sent for culture.

## 2017-04-01 MED ORDER — FENTANYL CITRATE (PF) 100 MCG/2ML IJ SOLN
INTRAMUSCULAR | Status: AC | PRN
  Administered 2017-04-01: 50 ug via INTRAVENOUS
  Administered 2017-04-01: 25 ug via INTRAVENOUS

## 2017-04-01 MED ORDER — FENTANYL CITRATE (PF) 100 MCG/2ML IJ SOLN
INTRAMUSCULAR | Status: AC
  Filled 2017-04-01: qty 4

## 2017-04-01 MED ORDER — MIDAZOLAM HCL 2 MG/2ML IJ SOLN
INTRAMUSCULAR | Status: AC
  Filled 2017-04-01: qty 6

## 2017-04-01 MED ORDER — MIDAZOLAM HCL 2 MG/2ML IJ SOLN
INTRAMUSCULAR | Status: AC | PRN
  Administered 2017-04-01 (×3): 1 mg via INTRAVENOUS

## 2017-04-01 MED ORDER — LIDOCAINE HCL (PF) 1 % IJ SOLN
INTRAMUSCULAR | Status: AC
Start: 2017-04-01 — End: 2017-04-02
  Filled 2017-04-01: qty 30

## 2017-04-01 NOTE — H&P (Signed)
Chief Complaint: diverticulitis with abscess  Referring Physician:Dr. Donnie Mesa  Supervising Physician: Corrie Mckusick  Patient Status: [facility] - In-pt  HPI: BESAN KETCHEM is a 66 y.o. female who has been having LLQ abdominal pain for the last 3-4 weeks but has been trying to continue on.  Last Tuesday she states the pain got significantly worse.  She went to an Urgent Care where she was felt to have diverticulitis.  She was started on Cipro and Flagyl; however, she states her pain continued to worsen.  She was running fevers over 101 at home for at least the last week.  She finally came to the ED yesterday secondary to persistent pain.  She had a CT scan that showed diverticulitis with an intra-abdominal abscess.  IR is asked to evaluate the patient for drainage.  She already feels better today since admission and being started on IV abx therapy.  Past Medical History:  Past Medical History:  Diagnosis Date  . Depression   . Hypertension   . Hypothyroidism   . Thyroid disease     Past Surgical History:  Past Surgical History:  Procedure Laterality Date  . TUBAL LIGATION      Family History: History reviewed. No pertinent family history.  Social History:  has no tobacco, alcohol, and drug history on file.  Allergies:  Allergies  Allergen Reactions  . Sulfa Antibiotics Other (See Comments)    Coma    Medications: Medications reviewed in epic  Please HPI for pertinent positives, otherwise complete 10 system ROS negative.  Mallampati Score: MD Evaluation Airway: WNL Heart: WNL Abdomen: Other (comments) Abdomen comments: tender in LLQ Chest/ Lungs: WNL ASA  Classification: 2 Mallampati/Airway Score: One  Physical Exam: BP (!) 101/57 (BP Location: Left Arm)   Pulse 64   Temp 98.4 F (36.9 C) (Oral)   Resp 16   Ht 5' 4"  (1.626 m)   Wt 140 lb (63.5 kg)   SpO2 98%   BMI 24.03 kg/m  Body mass index is 24.03 kg/m. General: pleasant, WD, WN female who is  laying in bed in NAD HEENT: head is normocephalic, atraumatic.  Sclera are noninjected.  PERRL.  Ears and nose without any masses or lesions.  Mouth is pink and moist Heart: regular, rate, and rhythm.  Normal s1,s2. No obvious murmurs, gallops, or rubs noted.  Palpable radial pulses bilaterally Lungs: CTAB, no wheezes, rhonchi, or rales noted.  Respiratory effort nonlabored Abd: soft, tender in LLQ and suprapubic, ND, +BS, no masses, hernias, or organomegaly Psych: A&Ox3 with an appropriate affect.   Labs: Results for orders placed or performed during the hospital encounter of 03/31/17 (from the past 48 hour(s))  Lipase, blood     Status: None   Collection Time: 03/31/17  8:23 AM  Result Value Ref Range   Lipase 30 11 - 51 U/L  Comprehensive metabolic panel     Status: Abnormal   Collection Time: 03/31/17  8:23 AM  Result Value Ref Range   Sodium 136 135 - 145 mmol/L   Potassium 3.2 (L) 3.5 - 5.1 mmol/L   Chloride 103 101 - 111 mmol/L   CO2 23 22 - 32 mmol/L   Glucose, Bld 127 (H) 65 - 99 mg/dL   BUN 10 6 - 20 mg/dL   Creatinine, Ser 0.84 0.44 - 1.00 mg/dL   Calcium 8.8 (L) 8.9 - 10.3 mg/dL   Total Protein 6.6 6.5 - 8.1 g/dL   Albumin 3.0 (L) 3.5 - 5.0  g/dL   AST 18 15 - 41 U/L   ALT 14 14 - 54 U/L   Alkaline Phosphatase 84 38 - 126 U/L   Total Bilirubin 0.7 0.3 - 1.2 mg/dL   GFR calc non Af Amer >60 >60 mL/min   GFR calc Af Amer >60 >60 mL/min    Comment: (NOTE) The eGFR has been calculated using the CKD EPI equation. This calculation has not been validated in all clinical situations. eGFR's persistently <60 mL/min signify possible Chronic Kidney Disease.    Anion gap 10 5 - 15  CBC     Status: Abnormal   Collection Time: 03/31/17  8:23 AM  Result Value Ref Range   WBC 16.3 (H) 4.0 - 10.5 K/uL   RBC 3.87 3.87 - 5.11 MIL/uL   Hemoglobin 11.3 (L) 12.0 - 15.0 g/dL   HCT 34.7 (L) 36.0 - 46.0 %   MCV 89.7 78.0 - 100.0 fL   MCH 29.2 26.0 - 34.0 pg   MCHC 32.6 30.0 - 36.0  g/dL   RDW 13.6 11.5 - 15.5 %   Platelets 622 (H) 150 - 400 K/uL  Urinalysis, Routine w reflex microscopic     Status: Abnormal   Collection Time: 03/31/17  8:30 AM  Result Value Ref Range   Color, Urine AMBER (A) YELLOW    Comment: BIOCHEMICALS MAY BE AFFECTED BY COLOR   APPearance HAZY (A) CLEAR   Specific Gravity, Urine 1.021 1.005 - 1.030   pH 5.0 5.0 - 8.0   Glucose, UA NEGATIVE NEGATIVE mg/dL   Hgb urine dipstick NEGATIVE NEGATIVE   Bilirubin Urine NEGATIVE NEGATIVE   Ketones, ur 5 (A) NEGATIVE mg/dL   Protein, ur 30 (A) NEGATIVE mg/dL   Nitrite NEGATIVE NEGATIVE   Leukocytes, UA NEGATIVE NEGATIVE   RBC / HPF 6-30 0 - 5 RBC/hpf   WBC, UA 0-5 0 - 5 WBC/hpf   Bacteria, UA RARE (A) NONE SEEN   Squamous Epithelial / LPF 0-5 (A) NONE SEEN   Mucous PRESENT   HIV antibody     Status: None   Collection Time: 03/31/17 11:06 AM  Result Value Ref Range   HIV Screen 4th Generation wRfx Non Reactive Non Reactive    Comment: (NOTE) Performed At: [facility] [place], [place] [id number] Lindon Romp MD PX:[id number]   I-Stat CG4 Lactic Acid, ED     Status: None   Collection Time: 03/31/17 11:20 AM  Result Value Ref Range   Lactic Acid, Venous 1.36 0.5 - 1.9 mmol/L  Wet prep, genital     Status: Abnormal   Collection Time: 03/31/17 11:50 AM  Result Value Ref Range   Yeast Wet Prep HPF POC NONE SEEN NONE SEEN   Trich, Wet Prep NONE SEEN NONE SEEN   Clue Cells Wet Prep HPF POC PRESENT (A) NONE SEEN   WBC, Wet Prep HPF POC FEW (A) NONE SEEN   Sperm NONE SEEN   Protime-INR     Status: Abnormal   Collection Time: 03/31/17  3:44 PM  Result Value Ref Range   Prothrombin Time 15.4 (H) 11.4 - 15.2 seconds   INR 1.21   APTT     Status: None   Collection Time: 03/31/17  3:44 PM  Result Value Ref Range   aPTT 31 24 - 36 seconds    Imaging: Ct Abdomen Pelvis W Contrast  Result Date: 03/31/2017 CLINICAL DATA:  Low abdominal pain for 2 weeks. Clinical  suspicion for diverticulitis. EXAM:  CT ABDOMEN AND PELVIS WITH CONTRAST TECHNIQUE: Multidetector CT imaging of the abdomen and pelvis was performed using the standard protocol following bolus administration of intravenous contrast. CONTRAST:  165m ISOVUE-300 IOPAMIDOL (ISOVUE-300) INJECTION 61% COMPARISON:  None. FINDINGS: Lower chest:  Unremarkable. Hepatobiliary: 1.7 cm low-density lesion posterior right liver has attenuation higher than would be expected for a simple cyst. Liver parenchyma otherwise unremarkable. There is no evidence for gallstones, gallbladder wall thickening, or pericholecystic fluid. No intrahepatic or extrahepatic biliary dilation. Pancreas: No focal mass lesion. No dilatation of the main duct. No intraparenchymal cyst. No peripancreatic edema. Spleen: No splenomegaly. No focal mass lesion. Adrenals/Urinary Tract: No adrenal nodule or mass. Right kidney unremarkable with prominent extrarenal pelvis. Right ureter within normal limits. The left kidney shows mild hydronephrosis and decreased perfusion compatible with obstructive uropathy. Source of urinary obstruction is the left pelvic sidewall inflammatory process (see below). Stomach/Bowel: Stomach is nondistended. No gastric wall thickening. No evidence of outlet obstruction. Duodenum is normally positioned as is the ligament of Treitz. No small bowel wall thickening. No small bowel dilatation. The terminal ileum is normal. The appendix is normal. No colonic dilatation. The sigmoid colon shows irregular rim wall thickening and there is a large para colonic collection of gas and air tracking up along the left pelvic sidewall. This measures 5.0 x 12.6 x 7.1 cm and is compatible with a a large para colonic abscess, likely of diverticular origin. This abscess is the cause of the left urinary obstruction and generates mass-effect on the left external iliac vessels, but the left external iliac and common iliac veins do appear to remain patent.  Vascular/Lymphatic: There is abdominal aortic atherosclerosis without aneurysm. There is no gastrohepatic or hepatoduodenal ligament lymphadenopathy. No intraperitoneal or retroperitoneal lymphadenopathy. No pelvic sidewall lymphadenopathy. Reproductive: The uterus has normal CT imaging appearance. No right adnexal mass. Left ovary appears to be incorporated in the inflammatory process along the left pelvic sidewall. Other: Trace intraperitoneal free fluid evident. Musculoskeletal: Bone windows reveal no worrisome lytic or sclerotic osseous lesions. IMPRESSION: 1. 5 x 13 x 7 cm abscess along the left pelvic sidewall appears to be secondary to sigmoid colon diverticulitis. Left ovary may be incorporated into the inflammatory process. 2. Left obstructive uropathy. The level of obstruction is at the may be ureter and secondary to the abscess along the left pelvic sidewall. 3. Insert athero Electronically Signed   By: EMisty StanleyM.D.   On: 03/31/2017 12:51    Assessment/Plan 1. Diverticulitis with microperforation and abscess  We will plan to proceed with drain placement today.  She is NPO and all of her labs and vitals have been reviewed.   Risks and benefits discussed with the patient including bleeding, infection, damage to adjacent structures, bowel perforation/fistula connection, and sepsis. All of the patient's questions were answered, patient is agreeable to proceed. Consent signed and in chart.   Thank you for this interesting consult.  I greatly enjoyed meeting DGENAVIEVE MANGIAPANEand look forward to participating in their care.  A copy of this report was sent to the requesting provider on this date.  Electronically Signed: OHenreitta Cea8/18/2018, 9:19 AM   I spent a total of 40 Minutes    in face to face in clinical consultation, greater than 50% of which was counseling/coordinating care for diverticular abscess

## 2017-04-01 NOTE — Progress Notes (Signed)
Subjective/Chief Complaint: Pt feels better this AM IR drain pending   Objective: Vital signs in last 24 hours: Temp:  [98.4 F (36.9 C)-101.7 F (38.7 C)] 98.4 F (36.9 C) (08/18 0616) Pulse Rate:  [54-92] 64 (08/18 0616) Resp:  [15-23] 16 (08/18 0616) BP: (85-125)/(46-104) 101/57 (08/18 0616) SpO2:  [97 %-100 %] 98 % (08/18 0616) Weight:  [63.5 kg (140 lb)] 63.5 kg (140 lb) (08/17 1835) Last BM Date: 04/01/17  Intake/Output from previous day: 08/17 0701 - 08/18 0700 In: 3175 [I.V.:925; IV Piggyback:2250] Out: -  Intake/Output this shift: No intake/output data recorded.    Constitutional: No acute distress, conversant, appears states age. Eyes: Anicteric sclerae, moist conjunctiva, no lid lag Lungs: Clear to auscultation bilaterally, normal respiratory effort CV: regular rate and rhythm, no murmurs, no peripheral edema, pedal pulses 2+ GI: Soft, no masses or hepatosplenomegaly, min tender to palpation lower abd, no rebound/guarding, active BS Skin: No rashes, palpation reveals normal turgor Psychiatric: appropriate judgment and insight, oriented to person, place, and time    Lab Results:   Recent Labs  03/31/17 0823  WBC 16.3*  HGB 11.3*  HCT 34.7*  PLT 622*   BMET  Recent Labs  03/31/17 0823  NA 136  K 3.2*  CL 103  CO2 23  GLUCOSE 127*  BUN 10  CREATININE 0.84  CALCIUM 8.8*   PT/INR  Recent Labs  03/31/17 1544  LABPROT 15.4*  INR 1.21   ABG No results for input(s): PHART, HCO3 in the last 72 hours.  Invalid input(s): PCO2, PO2  Studies/Results: Ct Abdomen Pelvis W Contrast  Result Date: 03/31/2017 CLINICAL DATA:  Low abdominal pain for 2 weeks. Clinical suspicion for diverticulitis. EXAM: CT ABDOMEN AND PELVIS WITH CONTRAST TECHNIQUE: Multidetector CT imaging of the abdomen and pelvis was performed using the standard protocol following bolus administration of intravenous contrast. CONTRAST:  ISOVUE-300 IOPAMIDOL (ISOVUE-300)  INJECTION 61% COMPARISON:  None. FINDINGS: Lower chest:  Unremarkable. Hepatobiliary: 1.7 cm low-density lesion posterior right liver has attenuation higher than would be expected for a simple cyst. Liver parenchyma otherwise unremarkable. There is no evidence for gallstones, gallbladder wall thickening, or pericholecystic fluid. No intrahepatic or extrahepatic biliary dilation. Pancreas: No focal mass lesion. No dilatation of the main duct. No intraparenchymal cyst. No peripancreatic edema. Spleen: No splenomegaly. No focal mass lesion. Adrenals/Urinary Tract: No adrenal nodule or mass. Right kidney unremarkable with prominent extrarenal pelvis. Right ureter within normal limits. The left kidney shows mild hydronephrosis and decreased perfusion compatible with obstructive uropathy. Source of urinary obstruction is the left pelvic sidewall inflammatory process (see below). Stomach/Bowel: Stomach is nondistended. No gastric wall thickening. No evidence of outlet obstruction. Duodenum is normally positioned as is the ligament of Treitz. No small bowel wall thickening. No small bowel dilatation. The terminal ileum is normal. The appendix is normal. No colonic dilatation. The sigmoid colon shows irregular rim wall thickening and there is a large para colonic collection of gas and air tracking up along the left pelvic sidewall. This measures 5.0 x 12.6 x 7.1 cm and is compatible with a a large para colonic abscess, likely of diverticular origin. This abscess is the cause of the left urinary obstruction and generates mass-effect on the left external iliac vessels, but the left external iliac and common iliac veins do appear to remain patent. Vascular/Lymphatic: There is abdominal aortic atherosclerosis without aneurysm. There is no gastrohepatic or hepatoduodenal ligament lymphadenopathy. No intraperitoneal or retroperitoneal lymphadenopathy. No pelvic sidewall lymphadenopathy. Reproductive: The  uterus has normal CT  imaging appearance. No right adnexal mass. Left ovary appears to be incorporated in the inflammatory process along the left pelvic sidewall. Other: Trace intraperitoneal free fluid evident. Musculoskeletal: Bone windows reveal no worrisome lytic or sclerotic osseous lesions. IMPRESSION: 1. 5 x 13 x 7 cm abscess along the left pelvic sidewall appears to be secondary to sigmoid colon diverticulitis. Left ovary may be incorporated into the inflammatory process. 2. Left obstructive uropathy. The level of obstruction is at the may be ureter and secondary to the abscess along the left pelvic sidewall. 3. Insert athero Electronically Signed   By: Kennith Center M.D.   On: 03/31/2017 12:51    Anti-infectives: Anti-infectives    Start     Dose/Rate Route Frequency Ordered Stop   03/31/17 1930  piperacillin-tazobactam (ZOSYN) IVPB 3.375 g     3.375 g 12.5 mL/hr over 240 Minutes Intravenous Every 8 hours 03/31/17 1831     03/31/17 1330  piperacillin-tazobactam (ZOSYN) IVPB 3.375 g     3.375 g 100 mL/hr over 30 Minutes Intravenous  Once 03/31/17 1323 03/31/17 1431      Assessment/Plan: Active Problems:   Abscess of sigmoid colon due to diverticulitis  66 y/o F w/ large peridivertivular abscess 1.  IR drain pending this AM 2. Con't abx 3. Likely OK for Clears after procedure    LOS: 1 day    Marigene Ehlers., [facility] 04/01/2017

## 2017-04-01 NOTE — Progress Notes (Signed)
Pt back from IR 

## 2017-04-01 NOTE — Procedures (Signed)
Interventional Radiology Procedure Note  Procedure: CT guided drain placement abd abscess.  42f drain. 20cc purulent fluid aspirated.  To gravity drain.  .  Complications: None  Recommendations:  - Sterile saline flush 5-10cc BID - TID.  - Record daily output. - Do not submerge - Routine line care   Signed,  Yvone Neu. Loreta Ave, DO

## 2017-04-01 NOTE — Progress Notes (Signed)
Pt transfer to IR

## 2017-04-02 ENCOUNTER — Encounter (HOSPITAL_COMMUNITY): Payer: Self-pay

## 2017-04-02 LAB — RPR: RPR: REACTIVE — AB

## 2017-04-02 LAB — RPR, QUANT+TP ABS (REFLEX): T Pallidum Abs: NEGATIVE

## 2017-04-02 NOTE — Progress Notes (Signed)
Subjective/Chief Complaint: Doing well  Ir drain placed yesterday   Objective: Vital signs in last 24 hours: Temp:  [98.4 F (36.9 C)-99.2 F (37.3 C)] 98.4 F (36.9 C) (08/19 0516) Pulse Rate:  [56-82] 56 (08/19 0516) Resp:  [12-17] 16 (08/19 0516) BP: (105-129)/(54-68) 129/65 (08/19 0516) SpO2:  [98 %-100 %] 99 % (08/19 0516) Last BM Date: 04/01/17  Intake/Output from previous day: 08/18 0701 - 08/19 0700 In: 3233.3 [P.O.:480; I.V.:2603.3; IV Piggyback:150] Out: 70 [Drains:70] Intake/Output this shift: No intake/output data recorded.  General appearance: alert and cooperative GI: soft, non-tender; bowel sounds normal; no masses,  no organomegaly and drain purulent  Lab Results:   Recent Labs  03/31/17 0823  WBC 16.3*  HGB 11.3*  HCT 34.7*  PLT 622*   BMET  Recent Labs  03/31/17 0823  NA 136  K 3.2*  CL 103  CO2 23  GLUCOSE 127*  BUN 10  CREATININE 0.84  CALCIUM 8.8*   PT/INR  Recent Labs  03/31/17 1544  LABPROT 15.4*  INR 1.21   ABG No results for input(s): PHART, HCO3 in the last 72 hours.  Invalid input(s): PCO2, PO2  Studies/Results: Ct Abdomen Pelvis W Contrast  Result Date: 03/31/2017 CLINICAL DATA:  Low abdominal pain for 2 weeks. Clinical suspicion for diverticulitis. EXAM: CT ABDOMEN AND PELVIS WITH CONTRAST TECHNIQUE: Multidetector CT imaging of the abdomen and pelvis was performed using the standard protocol following bolus administration of intravenous contrast. CONTRAST:  ISOVUE-300 IOPAMIDOL (ISOVUE-300) INJECTION 61% COMPARISON:  None. FINDINGS: Lower chest:  Unremarkable. Hepatobiliary: 1.7 cm low-density lesion posterior right liver has attenuation higher than would be expected for a simple cyst. Liver parenchyma otherwise unremarkable. There is no evidence for gallstones, gallbladder wall thickening, or pericholecystic fluid. No intrahepatic or extrahepatic biliary dilation. Pancreas: No focal mass lesion. No dilatation  of the main duct. No intraparenchymal cyst. No peripancreatic edema. Spleen: No splenomegaly. No focal mass lesion. Adrenals/Urinary Tract: No adrenal nodule or mass. Right kidney unremarkable with prominent extrarenal pelvis. Right ureter within normal limits. The left kidney shows mild hydronephrosis and decreased perfusion compatible with obstructive uropathy. Source of urinary obstruction is the left pelvic sidewall inflammatory process (see below). Stomach/Bowel: Stomach is nondistended. No gastric wall thickening. No evidence of outlet obstruction. Duodenum is normally positioned as is the ligament of Treitz. No small bowel wall thickening. No small bowel dilatation. The terminal ileum is normal. The appendix is normal. No colonic dilatation. The sigmoid colon shows irregular rim wall thickening and there is a large para colonic collection of gas and air tracking up along the left pelvic sidewall. This measures 5.0 x 12.6 x 7.1 cm and is compatible with a a large para colonic abscess, likely of diverticular origin. This abscess is the cause of the left urinary obstruction and generates mass-effect on the left external iliac vessels, but the left external iliac and common iliac veins do appear to remain patent. Vascular/Lymphatic: There is abdominal aortic atherosclerosis without aneurysm. There is no gastrohepatic or hepatoduodenal ligament lymphadenopathy. No intraperitoneal or retroperitoneal lymphadenopathy. No pelvic sidewall lymphadenopathy. Reproductive: The uterus has normal CT imaging appearance. No right adnexal mass. Left ovary appears to be incorporated in the inflammatory process along the left pelvic sidewall. Other: Trace intraperitoneal free fluid evident. Musculoskeletal: Bone windows reveal no worrisome lytic or sclerotic osseous lesions. IMPRESSION: 1. 5 x 13 x 7 cm abscess along the left pelvic sidewall appears to be secondary to sigmoid colon diverticulitis. Left ovary may be  incorporated  into the inflammatory process. 2. Left obstructive uropathy. The level of obstruction is at the may be ureter and secondary to the abscess along the left pelvic sidewall. 3. Insert athero Electronically Signed   By: Kennith Center M.D.   On: 03/31/2017 12:51   Ct Image Guided Drainage By Percutaneous Catheter  Result Date: 04/01/2017 INDICATION: 66 year old female with a history of diverticular abscess peer EXAM: CT GUIDED DRAINAGE OF  ABSCESS MEDICATIONS: The patient is currently admitted to the hospital and receiving intravenous antibiotics. The antibiotics were administered within an appropriate time frame prior to the initiation of the procedure. ANESTHESIA/SEDATION: 3.0 mg IV Versed 75 mcg IV Fentanyl Moderate Sedation Time:  14 minutes The patient was continuously monitored during the procedure by the interventional radiology nurse under my direct supervision. COMPLICATIONS: None TECHNIQUE: Informed written consent was obtained from the patient after a thorough discussion of the procedural risks, benefits and alternatives. All questions were addressed. Maximal Sterile Barrier Technique was utilized including caps, mask, sterile gowns, sterile gloves, sterile drape, hand hygiene and skin antiseptic. A timeout was performed prior to the initiation of the procedure. PROCEDURE: The operative field was prepped with chlorhexidine in a sterile fashion, and a sterile drape was applied covering the operative field. A sterile gown and sterile gloves were used for the procedure. Local anesthesia was provided with 1% Lidocaine. Once the patient is prepped and draped in the usual sterile fashion, the skin and subcutaneous tissues of the anterior abdominal wall were generously infiltrated with 1% lidocaine for local anesthesia. Using CT guidance, 18 gauge trocar needle was advanced into the abscess within the left pelvis. Once we confirmed the needle tip was in position with CT guidance and with aspiration of purulent  material, modified Seldinger technique was used to place a 10 French drainage catheter into the abscess. Approximately 20- 30 cc of purulent material aspirated for culture. Catheter was sutured to the anterior abdominal wall and a final image was stored. Catheter was attached to gravity drainage. Patient tolerated the procedure well and remained hemodynamically stable throughout. No complications were encountered and no significant blood loss. FINDINGS: Initial CT imaged re- demonstrates gas and fluid collection in the left pelvis compatible with diverticular abscess. Status post drainage catheter placement there is collapse of the fluid collection. IMPRESSION: Status post CT-guided drainage of left pelvic abscess. Sample was sent for culture. Signed, Yvone Neu. Loreta Ave, DO [facility] Electronically Signed   By: Gilmer Mor D.O.   On: 04/01/2017 17:43    Anti-infectives: Anti-infectives    Start     Dose/Rate Route Frequency Ordered Stop   03/31/17 1930  piperacillin-tazobactam (ZOSYN) IVPB 3.375 g     3.375 g 12.5 mL/hr over 240 Minutes Intravenous Every 8 hours 03/31/17 1831     03/31/17 1330  piperacillin-tazobactam (ZOSYN) IVPB 3.375 g     3.375 g 100 mL/hr over 30 Minutes Intravenous  Once 03/31/17 1323 03/31/17 1431      Assessment/Plan: Active Problems:   Abscess of sigmoid colon due to diverticulitis  66 y/o F w/ large peridivertivular abscess 1.  ADv diet as tol 2. Con't abx 3. Pt in the middle of moving to [place], but will be here for 6-8weeks.  Hopefulyl home in next 1-2d.  Will need f/u in drain clinic  LOS: 2 days    Marigene Ehlers., [facility] 04/02/2017

## 2017-04-02 NOTE — Progress Notes (Signed)
Patient ID: Stephanie Ho, female   DOB: 1951-06-25, 66 y.o.   MRN: [id number]    Referring Physician(s): Dr. Manus Rudd  Supervising Physician: Gilmer Mor  Patient Status: [facility] - In-pt  Chief Complaint: Diverticular abscess  Subjective: Patient has no pain today.  She has eaten clear liquids and is ready to go home.    Allergies: Sulfa antibiotics  Medications: Prior to Admission medications   Medication Sig Start Date End Date Taking? Authorizing Provider  levothyroxine (SYNTHROID, LEVOTHROID) 75 MCG tablet Take 75 mcg by mouth daily before breakfast.   Yes [provider]  losartan (COZAAR) 100 MG tablet Take 100 mg by mouth daily.   Yes [provider]  multivitamin-iron-minerals-folic acid (CENTRUM) chewable tablet Chew 1 tablet by mouth daily.   Yes [provider]  Naproxen-Esomeprazole (VIMOVO) 500-20 MG TBEC Take 1 tablet by mouth daily as needed. RA 03/17/17  Yes [provider]  sertraline (ZOLOFT) 50 MG tablet Take 50 mg by mouth daily.   Yes [provider]  traZODone (DESYREL) 150 MG tablet Take 50 mg by mouth at bedtime. 12/26/16  Yes [provider]    Vital Signs: BP 129/65 (BP Location: Left Arm)   Pulse (!) 56   Temp 98.4 F (36.9 C) (Oral)   Resp 16   Ht 5\' 4"  (1.626 m)   Wt 140 lb (63.5 kg)   SpO2 99%   BMI 24.03 kg/m   Physical Exam: Abd: soft, NT, drain site is c/d/i.  Drain with no output currently as it was just emptied.  Imaging: Ct Abdomen Pelvis W Contrast  Result Date: 03/31/2017 CLINICAL DATA:  Low abdominal pain for 2 weeks. Clinical suspicion for diverticulitis. EXAM: CT ABDOMEN AND PELVIS WITH CONTRAST TECHNIQUE: Multidetector CT imaging of the abdomen and pelvis was performed using the standard protocol following bolus administration of intravenous contrast. CONTRAST:  ISOVUE-300 IOPAMIDOL (ISOVUE-300) INJECTION 61% COMPARISON:  None. FINDINGS: Lower chest:  Unremarkable.  Hepatobiliary: 1.7 cm low-density lesion posterior right liver has attenuation higher than would be expected for a simple cyst. Liver parenchyma otherwise unremarkable. There is no evidence for gallstones, gallbladder wall thickening, or pericholecystic fluid. No intrahepatic or extrahepatic biliary dilation. Pancreas: No focal mass lesion. No dilatation of the main duct. No intraparenchymal cyst. No peripancreatic edema. Spleen: No splenomegaly. No focal mass lesion. Adrenals/Urinary Tract: No adrenal nodule or mass. Right kidney unremarkable with prominent extrarenal pelvis. Right ureter within normal limits. The left kidney shows mild hydronephrosis and decreased perfusion compatible with obstructive uropathy. Source of urinary obstruction is the left pelvic sidewall inflammatory process (see below). Stomach/Bowel: Stomach is nondistended. No gastric wall thickening. No evidence of outlet obstruction. Duodenum is normally positioned as is the ligament of Treitz. No small bowel wall thickening. No small bowel dilatation. The terminal ileum is normal. The appendix is normal. No colonic dilatation. The sigmoid colon shows irregular rim wall thickening and there is a large para colonic collection of gas and air tracking up along the left pelvic sidewall. This measures 5.0 x 12.6 x 7.1 cm and is compatible with a a large para colonic abscess, likely of diverticular origin. This abscess is the cause of the left urinary obstruction and generates mass-effect on the left external iliac vessels, but the left external iliac and common iliac veins do appear to remain patent. Vascular/Lymphatic: There is abdominal aortic atherosclerosis without aneurysm. There is no gastrohepatic or hepatoduodenal ligament lymphadenopathy. No intraperitoneal or retroperitoneal lymphadenopathy. No pelvic sidewall lymphadenopathy.  Reproductive: The uterus has normal CT imaging appearance. No right adnexal mass. Left ovary appears to be  incorporated in the inflammatory process along the left pelvic sidewall. Other: Trace intraperitoneal free fluid evident. Musculoskeletal: Bone windows reveal no worrisome lytic or sclerotic osseous lesions. IMPRESSION: 1. 5 x 13 x 7 cm abscess along the left pelvic sidewall appears to be secondary to sigmoid colon diverticulitis. Left ovary may be incorporated into the inflammatory process. 2. Left obstructive uropathy. The level of obstruction is at the may be ureter and secondary to the abscess along the left pelvic sidewall. 3. Insert athero Electronically Signed   By: Kennith Center M.D.   On: 03/31/2017 12:51   Ct Image Guided Drainage By Percutaneous Catheter  Result Date: 04/01/2017 INDICATION: 66 year old female with a history of diverticular abscess peer EXAM: CT GUIDED DRAINAGE OF  ABSCESS MEDICATIONS: The patient is currently admitted to the hospital and receiving intravenous antibiotics. The antibiotics were administered within an appropriate time frame prior to the initiation of the procedure. ANESTHESIA/SEDATION: 3.0 mg IV Versed 75 mcg IV Fentanyl Moderate Sedation Time:  14 minutes The patient was continuously monitored during the procedure by the interventional radiology nurse under my direct supervision. COMPLICATIONS: None TECHNIQUE: Informed written consent was obtained from the patient after a thorough discussion of the procedural risks, benefits and alternatives. All questions were addressed. Maximal Sterile Barrier Technique was utilized including caps, mask, sterile gowns, sterile gloves, sterile drape, hand hygiene and skin antiseptic. A timeout was performed prior to the initiation of the procedure. PROCEDURE: The operative field was prepped with chlorhexidine in a sterile fashion, and a sterile drape was applied covering the operative field. A sterile gown and sterile gloves were used for the procedure. Local anesthesia was provided with 1% Lidocaine. Once the patient is prepped and  draped in the usual sterile fashion, the skin and subcutaneous tissues of the anterior abdominal wall were generously infiltrated with 1% lidocaine for local anesthesia. Using CT guidance, 18 gauge trocar needle was advanced into the abscess within the left pelvis. Once we confirmed the needle tip was in position with CT guidance and with aspiration of purulent material, modified Seldinger technique was used to place a 10 French drainage catheter into the abscess. Approximately 20- 30 cc of purulent material aspirated for culture. Catheter was sutured to the anterior abdominal wall and a final image was stored. Catheter was attached to gravity drainage. Patient tolerated the procedure well and remained hemodynamically stable throughout. No complications were encountered and no significant blood loss. FINDINGS: Initial CT imaged re- demonstrates gas and fluid collection in the left pelvis compatible with diverticular abscess. Status post drainage catheter placement there is collapse of the fluid collection. IMPRESSION: Status post CT-guided drainage of left pelvic abscess. Sample was sent for culture. Signed, Yvone Neu. Loreta Ave, DO [facility] Electronically Signed   By: Gilmer Mor D.O.   On: 04/01/2017 17:43    Labs:  CBC:  Recent Labs  03/31/17 0823  WBC 16.3*  HGB 11.3*  HCT 34.7*  PLT 622*    COAGS:  Recent Labs  03/31/17 1544  INR 1.21  APTT 31    BMP:  Recent Labs  03/31/17 0823  NA 136  K 3.2*  CL 103  CO2 23  GLUCOSE 127*  BUN 10  CALCIUM 8.8*  CREATININE 0.84  GFRNONAA >60  GFRAA >60    LIVER FUNCTION TESTS:  Recent Labs  03/31/17 0823  BILITOT 0.7  AST 18  ALT 14  ALKPHOS 84  PROT 6.6  ALBUMIN 3.0*    Assessment and Plan: 1. Diverticular abscess, s/p perc drain  Cont drain and irrigation of the drain. May shower with the drain, but not submerge it. When patient is discharged, she can be set  up in drain clinic with repeat CT scan and drain evaluation.  Patient is hopeful she will be DC tomorrow as her diet is being advanced today.  Electronically Signed: Letha Cape 04/02/2017, 9:43 AM   I spent a total of 15 Minutes at the the patient's bedside AND on the patient's hospital floor or unit, greater than 50% of which was counseling/coordinating care for diverticular abscess

## 2017-04-03 ENCOUNTER — Encounter (HOSPITAL_COMMUNITY): Payer: Self-pay | Admitting: General Surgery

## 2017-04-03 DIAGNOSIS — I10 Essential (primary) hypertension: Secondary | ICD-10-CM

## 2017-04-03 HISTORY — DX: Essential (primary) hypertension: I10

## 2017-04-03 LAB — COMPREHENSIVE METABOLIC PANEL
ALT: 18 U/L (ref 14–54)
AST: 31 U/L (ref 15–41)
Albumin: 2.4 g/dL — ABNORMAL LOW (ref 3.5–5.0)
Alkaline Phosphatase: 63 U/L (ref 38–126)
Anion gap: 7 (ref 5–15)
CHLORIDE: 110 mmol/L (ref 101–111)
CO2: 25 mmol/L (ref 22–32)
Calcium: 8.5 mg/dL — ABNORMAL LOW (ref 8.9–10.3)
Creatinine, Ser: 0.72 mg/dL (ref 0.44–1.00)
GFR calc Af Amer: >60 mL/min (ref >60)
GFR calc non Af Amer: >60 mL/min (ref >60)
GLUCOSE: 118 mg/dL — AB (ref 65–99)
POTASSIUM: 3.3 mmol/L — AB (ref 3.5–5.1)
SODIUM: 142 mmol/L (ref 135–145)
Total Bilirubin: 0.4 mg/dL (ref 0.3–1.2)
Total Protein: 5.8 g/dL — ABNORMAL LOW (ref 6.5–8.1)

## 2017-04-03 LAB — GC/CHLAMYDIA PROBE AMP (~~LOC~~) NOT AT ARMC
CHLAMYDIA, DNA PROBE: NEGATIVE
Neisseria Gonorrhea: NEGATIVE

## 2017-04-03 MED ORDER — SODIUM CHLORIDE 0.9% FLUSH: 5.0000 mL | Freq: Three times a day (TID) | INTRAVENOUS | Status: DC

## 2017-04-03 MED ORDER — AMOXICILLIN-POT CLAVULANATE 875-125 MG PO TABS
1.0000 | ORAL_TABLET | Freq: Two times a day (BID) | ORAL | Status: DC
  Administered 2017-04-03: 1 via ORAL
  Filled 2017-04-03: qty 1

## 2017-04-03 MED ORDER — AMOXICILLIN-POT CLAVULANATE 875-125 MG PO TABS: 1.0000 | ORAL_TABLET | Freq: Two times a day (BID) | ORAL | 0 refills | Status: AC

## 2017-04-03 MED ORDER — SACCHAROMYCES BOULARDII 250 MG PO CAPS: ORAL_CAPSULE | ORAL | Status: AC

## 2017-04-03 MED ORDER — SODIUM CHLORIDE 0.9% FLUSH: 10.0000 mL | INTRAVENOUS | 1 refills | Status: DC | PRN

## 2017-04-03 MED ORDER — SODIUM CHLORIDE 0.9% FLUSH: 5.0000 mL | Freq: Three times a day (TID) | INTRAVENOUS | 1 refills | Status: AC

## 2017-04-03 MED ORDER — OXYCODONE HCL 5 MG PO TABS: ORAL_TABLET | ORAL | 0 refills | Status: AC

## 2017-04-03 MED ORDER — SACCHAROMYCES BOULARDII 250 MG PO CAPS
250.0000 mg | ORAL_CAPSULE | Freq: Two times a day (BID) | ORAL | Status: DC
  Administered 2017-04-03: 250 mg via ORAL
  Filled 2017-04-03: qty 1

## 2017-04-03 MED ORDER — ACETAMINOPHEN 325 MG PO TABS: 650.0000 mg | ORAL_TABLET | ORAL | Status: AC | PRN

## 2017-04-03 MED ORDER — OXYCODONE HCL 5 MG PO TABS
ORAL_TABLET | ORAL | 0 refills | Status: DC
Start: 2017-04-03 — End: 2017-04-03

## 2017-04-03 NOTE — Plan of Care (Signed)
Problem: Food- and Nutrition-Related Knowledge Deficit (NB-1.1) Goal: Nutrition education Formal process to instruct or train a patient/client in a skill or to impart knowledge to help patients/clients voluntarily manage or modify food choices and eating behavior to maintain or improve health. Outcome: Adequate for Discharge Nutrition Education Note  RD consulted for nutrition education regarding nutrition management for diverticulosis/ Diverticulitis.    RD provided "Low Fiber Nutrition Therapy" handout from the Academy of [employer]etics. Reviewed patient's dietary recall and discussed ways for pt to meet nutrition goals over the next several weeks. Explained reasons for pt to follow a low fiber diet over the next 6 weeks. Reviewed low fiber foods and high fiber foods. Discussed best practice for long term management of diverticulosis is a high fiber diet and discussed ways to gradually increase fiber in the diet.   Teach back method used. Pt verbalizes understanding of information provided.   Expect very good compliance.  Body mass index is Body mass index is 24.03 kg/m.Marland Kitchen Pt meets criteria for normal weight range based on current BMI.  Current diet order is Soft, patient is consuming approximately 100% of meals at this time. Labs and medications reviewed. No further nutrition interventions warranted at this time. RD contact information provided. If additional nutrition issues arise, please re-consult RD.  Geroge Gilliam A. Mayford Knife, RD, LDN, CDE Pager: [phone] After hours Pager: [phone]

## 2017-04-03 NOTE — Progress Notes (Signed)
    CC: Abdominal pain  Subjective: She looks great, Dr. Derrell Lolling told them they could advance her diet and she got a regular diet last PM.  Her pain is almost completely resolved.  Drainage is still cloudy serous fluid.  Tolerating diet well.    Objective: Vital signs in last 24 hours: Temp:  [98.4 F (36.9 C)-98.6 F (37 C)] 98.5 F (36.9 C) (08/20 0505) Pulse Rate:  [55-66] 66 (08/20 0505) Resp:  [16] 16 (08/20 0505) BP: (124-129)/(61-66) 124/62 (08/20 0505) SpO2:  [97 %-100 %] 98 % (08/20 0505) Last BM Date: 04/01/17 1302 PO IV 1000 Urine x 6 Stool x 4 Drain 45 Afebrile, VSS Labs 8/17:  K+ - 3.2, glucose 127, WBC 16.3   Intake/Output from previous day: 08/19 0701 - 08/20 0700 In: 2352 [P.O.:1302; I.V.:1000; IV Piggyback:50] Out: 45 [Drains:45] Intake/Output this shift: No intake/output data recorded.  General appearance: alert, cooperative and no distress Resp: clear to auscultation bilaterally GI: soft, pain is much better, site looks fine. drainage is cloudy serous fluid.  Lab Results:  No results for input(s): WBC, HGB, HCT, PLT in the last 72 hours.  BMET No results for input(s): NA, K, CL, CO2, GLUCOSE, BUN, CREATININE, CALCIUM in the last 72 hours. PT/INR  Recent Labs  03/31/17 1544  LABPROT 15.4*  INR 1.21     Recent Labs Lab 03/31/17 0823  AST 18  ALT 14  ALKPHOS 84  BILITOT 0.7  PROT 6.6  ALBUMIN 3.0*     Lipase     Component Value Date/Time   LIPASE 30 03/31/2017 0823     Medications: . enoxaparin (LOVENOX) injection  40 mg Subcutaneous Q24H  . levothyroxine  75 mcg Oral QAC breakfast  . losartan  100 mg Oral Daily  . sertraline  50 mg Oral Daily   . sodium chloride Stopped (04/02/17 1815)  . piperacillin-tazobactam (ZOSYN)  IV 3.375 g (04/03/17 0546)   Anti-infectives    Start     Dose/Rate Route Frequency Ordered Stop   03/31/17 1930  piperacillin-tazobactam (ZOSYN) IVPB 3.375 g     3.375 g 12.5 mL/hr over 240 Minutes  Intravenous Every 8 hours 03/31/17 1831     03/31/17 1330  piperacillin-tazobactam (ZOSYN) IVPB 3.375 g     3.375 g 100 mL/hr over 30 Minutes Intravenous  Once 03/31/17 1323 03/31/17 1431     Specimen Description ABSCESS ABDOMEN   04/01/17  Special Requests NONE   Gram Stain ABUNDANT WBC PRESENT, PREDOMINANTLY PMN  FEW GRAM POSITIVE COCCI IN PAIRS  FEW GRAM NEGATIVE RODS      Culture TOO YOUNG TO READ      Assessment/Plan Diverticulitis with perforation/abscess IR drain 04/01/17 Hypertension Hypothyroid Depression FEN: IV fluids/Regular diet =>> clears ID:  Zosyn 8/17 =>> day 3 DVT:  Lovenox   Plan:  Go to a soft diet, recheck labs in AM, Radiology has been by and we are already setting her up for drain clinic next week. Plan home with drain.   Add probiotic and continue antibiotics.  She is anxious to go home.  Her husband is going to [place] next week and they anticipate moving in about 6 weeks.  Discussed getting over this and then follow up once she gets to [place].       LOS: 3 days    Aj Crunkleton 04/03/2017 [phone]

## 2017-04-03 NOTE — Care Management Note (Signed)
Case Management Note  Patient Details  Name: Stephanie Ho MRN: [id number] Date of Birth: 22-Nov-1950  Subjective/Objective:                    Action/Plan:  Consult to offer home health. Discussed with patient at bedside. Patient does not feel that she needs home health at this time ,just education on drain care before discharge.  Bedside nurse Dorothy aware and will teach patient drain care . Expected Discharge Date:  04/03/17               Expected Discharge Plan:  Home/Self Care  In-House Referral:     Discharge planning Services  CM Consult  Post Acute Care Choice:  Home Health Choice offered to:  Patient  DME Arranged:    DME Agency:     HH Arranged:    HH Agency:     Status of Service:  Completed, signed off  If discussed at [employer], dates discussed:    Additional Comments:  Kingsley Plan, RN 04/03/2017, 11:57 AM

## 2017-04-03 NOTE — Progress Notes (Signed)
Referring Physician(s): Dr Meridee Score  Supervising Physician: Oley Balm  Patient Status:  Stephanie Ho - In-pt  Chief Complaint:  Diverticular abscess  Subjective:  LLQ abscess drain placed 8/18 Doing well Advancing diet per CCM Output cloudy   Allergies: Sulfa antibiotics  Medications: Prior to Admission medications   Medication Sig Start Date End Date Taking? Authorizing Provider  levothyroxine (SYNTHROID, LEVOTHROID) 75 MCG tablet Take 75 mcg by mouth daily before breakfast.   Yes [provider]  losartan (COZAAR) 100 MG tablet Take 100 mg by mouth daily.   Yes [provider]  multivitamin-iron-minerals-folic acid (CENTRUM) chewable tablet Chew 1 tablet by mouth daily.   Yes [provider]  Naproxen-Esomeprazole (VIMOVO) 500-20 MG TBEC Take 1 tablet by mouth daily as needed. RA 03/17/17  Yes [provider]  sertraline (ZOLOFT) 50 MG tablet Take 50 mg by mouth daily.   Yes [provider]  traZODone (DESYREL) 150 MG tablet Take 50 mg by mouth at bedtime. 12/26/16  Yes [provider]     Vital Signs: BP 124/62 (BP Location: Left Arm)   Pulse 66   Temp 98.5 F (36.9 C) (Oral)   Resp 16   Ht 5\' 4"  (1.626 m)   Wt 140 lb (63.5 kg)   SpO2 98%   BMI 24.03 kg/m   Physical Exam  Constitutional: She is oriented to person, place, and time.  Abdominal: Soft.  Musculoskeletal: Normal range of motion.  Neurological: She is alert and oriented to person, place, and time.  Skin: Skin is warm and dry.  Site is clean and dry NT no redness Output 45 cc yesterday 10-15 cc in bag now- cloudy yellow Abundant wbc on cx   Psychiatric: She has a normal mood and affect. Her behavior is normal.  Nursing note and vitals reviewed.   Imaging: Ct Abdomen Pelvis W Contrast  Result Date: 03/31/2017 CLINICAL DATA:  Low abdominal pain for 2 weeks. Clinical suspicion for diverticulitis. EXAM: CT ABDOMEN AND PELVIS WITH CONTRAST  TECHNIQUE: Multidetector CT imaging of the abdomen and pelvis was performed using the standard protocol following bolus administration of intravenous contrast. CONTRAST:  ISOVUE-300 IOPAMIDOL (ISOVUE-300) INJECTION 61% COMPARISON:  None. FINDINGS: Lower chest:  Unremarkable. Hepatobiliary: 1.7 cm low-density lesion posterior right liver has attenuation higher than would be expected for a simple cyst. Liver parenchyma otherwise unremarkable. There is no evidence for gallstones, gallbladder wall thickening, or pericholecystic fluid. No intrahepatic or extrahepatic biliary dilation. Pancreas: No focal mass lesion. No dilatation of the main duct. No intraparenchymal cyst. No peripancreatic edema. Spleen: No splenomegaly. No focal mass lesion. Adrenals/Urinary Tract: No adrenal nodule or mass. Right kidney unremarkable with prominent extrarenal pelvis. Right ureter within normal limits. The left kidney shows mild hydronephrosis and decreased perfusion compatible with obstructive uropathy. Source of urinary obstruction is the left pelvic sidewall inflammatory process (see below). Stomach/Bowel: Stomach is nondistended. No gastric wall thickening. No evidence of outlet obstruction. Duodenum is normally positioned as is the ligament of Treitz. No small bowel wall thickening. No small bowel dilatation. The terminal ileum is normal. The appendix is normal. No colonic dilatation. The sigmoid colon shows irregular rim wall thickening and there is a large para colonic collection of gas and air tracking up along the left pelvic sidewall. This measures 5.0 x 12.6 x 7.1 cm and is compatible with a a large para colonic abscess, likely of diverticular origin. This abscess is the cause of the left urinary obstruction and generates  mass-effect on the left external iliac vessels, but the left external iliac and common iliac veins do appear to remain patent. Vascular/Lymphatic: There is abdominal aortic atherosclerosis without  aneurysm. There is no gastrohepatic or hepatoduodenal ligament lymphadenopathy. No intraperitoneal or retroperitoneal lymphadenopathy. No pelvic sidewall lymphadenopathy. Reproductive: The uterus has normal CT imaging appearance. No right adnexal mass. Left ovary appears to be incorporated in the inflammatory process along the left pelvic sidewall. Other: Trace intraperitoneal free fluid evident. Musculoskeletal: Bone windows reveal no worrisome lytic or sclerotic osseous lesions. IMPRESSION: 1. 5 x 13 x 7 cm abscess along the left pelvic sidewall appears to be secondary to sigmoid colon diverticulitis. Left ovary may be incorporated into the inflammatory process. 2. Left obstructive uropathy. The level of obstruction is at the may be ureter and secondary to the abscess along the left pelvic sidewall. 3. Insert athero Electronically Signed   By: Kennith Center M.D.   On: 03/31/2017 12:51   Ct Image Guided Drainage By Percutaneous Catheter  Result Date: 04/01/2017 INDICATION: 66 year old female with a history of diverticular abscess peer EXAM: CT GUIDED DRAINAGE OF  ABSCESS MEDICATIONS: The patient is currently admitted to the hospital and receiving intravenous antibiotics. The antibiotics were administered within an appropriate time frame prior to the initiation of the procedure. ANESTHESIA/SEDATION: 3.0 mg IV Versed 75 mcg IV Fentanyl Moderate Sedation Time:  14 minutes The patient was continuously monitored during the procedure by the interventional radiology nurse under my direct supervision. COMPLICATIONS: None TECHNIQUE: Informed written consent was obtained from the patient after a thorough discussion of the procedural risks, benefits and alternatives. All questions were addressed. Maximal Sterile Barrier Technique was utilized including caps, mask, sterile gowns, sterile gloves, sterile drape, hand hygiene and skin antiseptic. A timeout was performed prior to the initiation of the procedure. PROCEDURE: The  operative field was prepped with chlorhexidine in a sterile fashion, and a sterile drape was applied covering the operative field. A sterile gown and sterile gloves were used for the procedure. Local anesthesia was provided with 1% Lidocaine. Once the patient is prepped and draped in the usual sterile fashion, the skin and subcutaneous tissues of the anterior abdominal wall were generously infiltrated with 1% lidocaine for local anesthesia. Using CT guidance, 18 gauge trocar needle was advanced into the abscess within the left pelvis. Once we confirmed the needle tip was in position with CT guidance and with aspiration of purulent material, modified Seldinger technique was used to place a 10 French drainage catheter into the abscess. Approximately 20- 30 cc of purulent material aspirated for culture. Catheter was sutured to the anterior abdominal wall and a final image was stored. Catheter was attached to gravity drainage. Patient tolerated the procedure well and remained hemodynamically stable throughout. No complications were encountered and no significant blood loss. FINDINGS: Initial CT imaged re- demonstrates gas and fluid collection in the left pelvis compatible with diverticular abscess. Status post drainage catheter placement there is collapse of the fluid collection. IMPRESSION: Status post CT-guided drainage of left pelvic abscess. Sample was sent for culture. Signed, Yvone Neu. Loreta Ave, DO [facility] Radiology Electronically Signed   By: Gilmer Mor D.O.   On: 04/01/2017 17:43    Labs:  CBC:  Recent Labs  03/31/17 0823  WBC 16.3*  HGB 11.3*  HCT 34.7*  PLT 622*    COAGS:  Recent Labs  03/31/17 1544  INR 1.21  APTT 31    BMP:  Recent Labs  03/31/17  0823  NA 136  K 3.2*  CL 103  CO2 23  GLUCOSE 127*  BUN 10  CALCIUM 8.8*  CREATININE 0.84  GFRNONAA >60  GFRAA >60    LIVER FUNCTION TESTS:  Recent Labs  03/31/17 0823   BILITOT 0.7  AST 18  ALT 14  ALKPHOS 84  PROT 6.6  ALBUMIN 3.0*    Assessment and Plan:  Diverticular abscess drain placed 8/18 Doing well Eating more/tolerating Will follow in drain clinic---she will hear from IR drain clinic scheduler for follow up time and date Keep site dry; record output Plan per CCM  Electronically Signed: Duell Holdren A, PA-C 04/03/2017, 9:40 AM   I spent a total of 15 Minutes at the the patient's bedside AND on the patient's hospital floor or unit, greater than 50% of which was counseling/coordinating care for abd abscess drain

## 2017-04-03 NOTE — Discharge Instructions (Signed)
OB-GYN follow up:  for cervical polyp  - call your [facility] doctor for follow up or ask your primary care doctor about this.   Diverticulitis Diverticulitis is inflammation or infection of small pouches in your colon that form when you have a condition called diverticulosis. The pouches in your colon are called diverticula. Your colon, or large intestine, is where water is absorbed and stool is formed. Complications of diverticulitis can include:  Bleeding.  Severe infection.  Severe pain.  Perforation of your colon.  Obstruction of your colon.  What are the causes? Diverticulitis is caused by bacteria. Diverticulitis happens when stool becomes trapped in diverticula. This allows bacteria to grow in the diverticula, which can lead to inflammation and infection. What increases the risk? People with diverticulosis are at risk for diverticulitis. Eating a diet that does not include enough fiber from fruits and vegetables may make diverticulitis more likely to develop. What are the signs or symptoms? Symptoms of diverticulitis may include:  Abdominal pain and tenderness. The pain is normally located on the left side of the abdomen, but may occur in other areas.  Fever and chills.  Bloating.  Cramping.  Nausea.  Vomiting.  Constipation.  Diarrhea.  Blood in your stool.  How is this diagnosed? Your health care provider will ask you about your medical history and do a physical exam. You may need to have tests done because many medical conditions can cause the same symptoms as diverticulitis. Tests may include:  Blood tests.  Urine tests.  Imaging tests of the abdomen, including X-rays and CT scans.  When your condition is under control, your health care provider may recommend that you have a colonoscopy. A colonoscopy can show how severe your diverticula are and whether something else is causing your symptoms. How is this treated? Most cases of diverticulitis are mild and  can be treated at home. Treatment may include:  Taking over-the-counter pain medicines.  Following a clear liquid diet.  Taking antibiotic medicines by mouth for 7-10 days.  More severe cases may be treated at a hospital. Treatment may include:  Not eating or drinking.  Taking prescription pain medicine.  Receiving antibiotic medicines through an IV tube.  Receiving fluids and nutrition through an IV tube.  Surgery.  Follow these instructions at home:  Follow your health care providers instructions carefully.  Follow a full liquid diet or other diet as directed by your health care provider. After your symptoms improve, your health care provider may tell you to change your diet. He or she may recommend you eat a high-fiber diet. Fruits and vegetables are good sources of fiber. Fiber makes it easier to pass stool.  Take fiber supplements or probiotics as directed by your health care provider.  Only take medicines as directed by your health care provider.  Keep all your follow-up appointments. Contact a health care provider if:  Your pain does not improve.  You have a hard time eating food.  Your bowel movements do not return to normal. Get help right away if:  Your pain becomes worse.  Your symptoms do not get better.  Your symptoms suddenly get worse.  You have a fever.  You have repeated vomiting.  You have bloody or black, tarry stools.  CALL THE OFFICE FIRST, BUT WE WILL MOST LIKELY SEND YOU TO THE EMERGENCY DEPARTMENT. This information is not intended to replace advice given to you by your health care provider. Make sure you discuss any questions you have with  your health care provider. Document Released: 05/11/2005 Document Revised: 01/07/2016 Document Reviewed: 06/26/2013 Elsevier Interactive Patient Education  2017 [employer]  IRRIGATE DRAIN 3 TIMES A DAY WITH 55ml of NORMAL SALINE.  RECORD DRAINAGE EACH DAY AND BRING WITH YOU TO ALL FOLLOW UP  APPOINTMENTS.

## 2017-04-03 NOTE — Discharge Summary (Signed)
Physician Discharge Summary  Patient ID: Stephanie Ho MRN: [id number] DOB/AGE: 15-Dec-1950 66 y.o.  Admit date: 03/31/2017 Discharge date: 04/03/2017  Admission Diagnoses:  CC:  Abdominal pain and pressure Sigmoid diverticulitis with large pericolonic diverticular abscess Hypertension Hypothyroid Depression   Discharge Diagnoses:  Sigmoid diverticulitis with large pericolonic diverticular abscess Hypertension Hypothyroid Depression    Principal Problem:   Abscess of sigmoid colon due to diverticulitis Active Problems:   Essential hypertension, benign   Hypothyroidism   Depression   PROCEDURES: IR CT guided drain placement abd abscess.  35f drain. 20cc purulent fluid aspirated.  To gravity drain; 04/01/17 Dr. Alyce Pagan Course: this is a 66 year old female in good health who presents with a one-month history of Left lower quadrant abdominal pain. Over the last several days this has migrated to the lower mid abdomen and suprapubic region and has become more severe. This pain radiates through to her back. She describes a lot of pressure in her lower abdomen.her symptoms began about 4 weeks ago. She had been constipated for a few days after recent change in her antidepression medications. She was eating when she felt a sudden onset of left lower quadrant pain. This was very ere. The pain abated somewhat and she did not seek any medical care at that time. However over the last several weeks her pain has worsened. She was seen at an urgent care earlier this week and had a temperature 11.4. She was diagnosed with diverticulitis based on clinical examination and was given prescriptions for Cipro and Flagyl. However symptoms have worsened. She has also noticed some more difficulty with urination. She presented to the emergency department today for evaluation. She was noted to have what appears to be sigmoid diverticulitis with a very large abscess with compression of the left  ureter. Abscess measure 5.0 x 12.6 x 7.1 cm.  There is mild left hydronephrosis.   She was seen in the ED and admitted by Dr. Corliss Skains.  She was seen in IR and underwent CT guided placement of a drain from the abscess.  Culture grew out a FEW VIRIDANS STREPTOCOCCUS, CULTURE REINCUBATED FOR BETTER GROWTH and is still incomplete. Pt was placed on IV Zosyn on admit and showed dramatic improvement with the Drain and antibiotics.  She looked so good she was discharged on 8/20, with 2 more weeks of PO Augmentin.  She has a follow up in the drain clinic next week and follow up with Dr. Derrell Lolling the following week.  She will need to follow up with GI and colonoscopy in about 6 weeks and can do this after her move to [place].    CBC Latest Ref Rng & Units 03/31/2017  WBC 4.0 - 10.5 K/uL 16.3(H)  Hemoglobin 12.0 - 15.0 g/dL 11.3(L)  Hematocrit 36.0 - 46.0 % 34.7(L)  Platelets 150 - 400 K/uL 622(H)     CMP Latest Ref Rng & Units 04/03/2017 03/31/2017  Glucose 65 - 99 mg/dL 409(W) 119(J)  BUN 6 - 20 mg/dL <4(N) 10  Creatinine 8.29 - 1.00 mg/dL 5.62 1.30  Sodium 865 - 145 mmol/L 142 136  Potassium 3.5 - 5.1 mmol/L 3.3(L) 3.2(L)  Chloride 101 - 111 mmol/L 110 103  CO2 22 - 32 mmol/L 25 23  Calcium 8.9 - 10.3 mg/dL 7.8(I) 6.9(G)  Total Protein 6.5 - 8.1 g/dL 2.9(B) 6.6  Total Bilirubin 0.3 - 1.2 mg/dL 0.4 0.7  Alkaline Phos 38 - 126 U/L 63 84  AST 15 - 41 U/L 31 18  ALT 14 - 54 U/L 18 14  Condition on D/C:  Improved  Instructions for drain care have been provided.     Disposition: 01-Home or Self Care   Allergies as of 04/03/2017      Reactions   Sulfa Antibiotics Other (See Comments)   Coma      Medication List    TAKE these medications   acetaminophen 325 MG tablet Commonly known as:  TYLENOL Take 2 tablets (650 mg total) by mouth every 4 (four) hours as needed for mild pain, moderate pain or fever.   amoxicillin-clavulanate 875-125 MG tablet Commonly known as:  AUGMENTIN Take 1 tablet by  mouth every 12 (twelve) hours.   levothyroxine 75 MCG tablet Commonly known as:  SYNTHROID, LEVOTHROID Take 75 mcg by mouth daily before breakfast.   losartan 100 MG tablet Commonly known as:  COZAAR Take 100 mg by mouth daily.   multivitamin-iron-minerals-folic acid chewable tablet Chew 1 tablet by mouth daily.   oxyCODONE 5 MG immediate release tablet Commonly known as:  Oxy IR/ROXICODONE You can take one tablet every 4 hours as needed for pain.  If you have pain that is acute enough to take these you need to come back to the ED and be evaluated.   saccharomyces boulardii 250 MG capsule Commonly known as:  FLORASTOR You can buy this over the counter at any drug store, it will be with the other Medicines for constipation like issues.   Follow package instructions.   sertraline 50 MG tablet Commonly known as:  ZOLOFT Take 50 mg by mouth daily.   sodium chloride flush 0.9 % Soln Commonly known as:  NS Place 5 mLs into feeding tube every 8 (eight) hours.   traZODone 150 MG tablet Commonly known as:  DESYREL Take 50 mg by mouth at bedtime.   VIMOVO 500-20 MG Tbec Generic drug:  Naproxen-Esomeprazole Take 1 tablet by mouth daily as needed. RA      Follow-up Information    Axel Filler, MD Follow up on 04/20/2017.   Specialty:  General Surgery Why:  Your appointment, is at 11:40 AM.  Be at the office 30 minutes early for check in.   Contact information: [address] [place] [address] [phone]           Signed: Sherrie George 04/04/2017, 3:36 PM

## 2017-04-04 ENCOUNTER — Other Ambulatory Visit: Payer: Self-pay | Admitting: General Surgery

## 2017-04-04 DIAGNOSIS — F32A Depression, unspecified: Secondary | ICD-10-CM

## 2017-04-04 DIAGNOSIS — E039 Hypothyroidism, unspecified: Secondary | ICD-10-CM

## 2017-04-04 DIAGNOSIS — K5720 Diverticulitis of large intestine with perforation and abscess without bleeding: Secondary | ICD-10-CM

## 2017-04-04 DIAGNOSIS — F329 Major depressive disorder, single episode, unspecified: Secondary | ICD-10-CM

## 2017-04-04 LAB — AEROBIC/ANAEROBIC CULTURE (SURGICAL/DEEP WOUND)

## 2017-04-04 LAB — AEROBIC/ANAEROBIC CULTURE W GRAM STAIN (SURGICAL/DEEP WOUND)

## 2017-04-05 LAB — CULTURE, BLOOD (ROUTINE X 2)
CULTURE: NO GROWTH
CULTURE: NO GROWTH
SPECIAL REQUESTS: ADEQUATE
Special Requests: ADEQUATE

## 2017-04-11 ENCOUNTER — Ambulatory Visit
Admission: RE | Admit: 2017-04-11 | Discharge: 2017-04-11 | Disposition: A | Discharge status: Home / Self Care | Payer: BLUE CROSS/BLUE SHIELD | Source: Ambulatory Visit | Attending: General Surgery | Admitting: General Surgery

## 2017-04-11 ENCOUNTER — Encounter: Payer: Self-pay | Admitting: Radiology

## 2017-04-11 ENCOUNTER — Ambulatory Visit
Admission: RE | Admit: 2017-04-11 | Discharge: 2017-04-11 | Disposition: A | Discharge status: Home / Self Care | Payer: BLUE CROSS/BLUE SHIELD | Source: Ambulatory Visit | Attending: Radiology | Admitting: Radiology

## 2017-04-11 DIAGNOSIS — K5720 Diverticulitis of large intestine with perforation and abscess without bleeding: Secondary | ICD-10-CM

## 2017-04-11 HISTORY — PX: IR RADIOLOGIST EVAL & MGMT: IMG5224

## 2017-04-11 MED ORDER — IOPAMIDOL (ISOVUE-300) INJECTION 61%
100.0000 mL | Freq: Once | INTRAVENOUS | Status: AC | PRN
  Administered 2017-04-11: 100 mL via INTRAVENOUS

## 2017-04-11 NOTE — Progress Notes (Signed)
Chief Complaint: Status post percutaneous catheter drainage of sigmoid diverticular abscess on 04/01/2017.  History of Present Illness: Stephanie Ho is a 66 y.o. female who presented on 03/31/2017 with one month of left lower quadrant abdominal pain. CT demonstrated diverticulitis with an associated large diverticular abscess. She underwent percutaneous catheter drainage on 04/01/2017 and was discharged from the hospital on 04/03/2017. Since that time, she has been flushing the drain at home and has not had any significant output from the drain over the last several days with only a small amount of serous output present in the drainage bag tubing. She denies fever or abdominal pain. She remains on oral antibiotics. She has a surgical follow-up appointment next Thursday. She is planning on moving to [place] soon in late September.  Past Medical History:  Diagnosis Date  . Depression   . Essential hypertension, benign 04/03/2017  . Hypertension   . Hypothyroidism   . Thyroid disease     Past Surgical History:  Procedure Laterality Date  . TUBAL LIGATION      Allergies: Sulfa antibiotics  Medications: Prior to Admission medications   Medication Sig Start Date End Date Taking? Authorizing Provider  acetaminophen (TYLENOL) 325 MG tablet Take 2 tablets (650 mg total) by mouth every 4 (four) hours as needed for mild pain, moderate pain or fever. 04/03/17   Sherrie George, PA-C  amoxicillin-clavulanate (AUGMENTIN) 875-125 MG tablet Take 1 tablet by mouth every 12 (twelve) hours. 04/03/17   Sherrie George, PA-C  levothyroxine (SYNTHROID, LEVOTHROID) 75 MCG tablet Take 75 mcg by mouth daily before breakfast.    [provider]  losartan (COZAAR) 100 MG tablet Take 100 mg by mouth daily.    [provider]  multivitamin-iron-minerals-folic acid (CENTRUM) chewable tablet Chew 1 tablet by mouth daily.    [provider]  Naproxen-Esomeprazole (VIMOVO) 500-20 MG  TBEC Take 1 tablet by mouth daily as needed. RA 03/17/17   [provider]  oxyCODONE (OXY IR/ROXICODONE) 5 MG immediate release tablet You can take one tablet every 4 hours as needed for pain.  If you have pain that is acute enough to take these you need to come back to the ED and be evaluated. 04/03/17   Adam Phenix, PA-C  saccharomyces boulardii (FLORASTOR) 250 MG capsule You can buy this over the counter at any drug store, it will be with the other Medicines for constipation like issues.   Follow package instructions. 04/03/17   Sherrie George, PA-C  sertraline (ZOLOFT) 50 MG tablet Take 50 mg by mouth daily.    [provider]  sodium chloride flush (NS) 0.9 % SOLN Place 5 mLs into feeding tube every 8 (eight) hours. 04/03/17   Sherrie George, PA-C  traZODone (DESYREL) 150 MG tablet Take 50 mg by mouth at bedtime. 12/26/16   [provider]     No family history on file.  Social History   Social History  . Marital status: Married    Spouse name: N/A  . Number of children: N/A  . Years of education: N/A   Social History Main Topics  . Smoking status: Never Smoker  . Smokeless tobacco: Never Used  . Alcohol use No  . Drug use: No  . Sexual activity: Not on file   Other Topics Concern  . Not on file   Social History Narrative  . No narrative on file    Review of Systems: A 12 point ROS discussed and pertinent positives are indicated  in the HPI above.  All other systems are negative.  Review of Systems  Constitutional: Negative.   Respiratory: Negative.   Cardiovascular: Negative.   Gastrointestinal: Negative.   Genitourinary: Negative.   Musculoskeletal: Negative.   Neurological: Negative.     Vital Signs: BP (!) 161/67   Pulse 67   Temp 98.5 F (36.9 C) (Oral)   SpO2 99%   Physical Exam  Constitutional: She is oriented to person, place, and time. She appears well-developed and well-nourished. No distress.  Abdominal: Soft. She  exhibits no distension. There is no tenderness. There is no rebound and no guarding.  Neurological: She is alert and oriented to person, place, and time.  Skin: She is not diaphoretic.  Vitals reviewed.   Imaging: Ct Abdomen Pelvis W Contrast  Result Date: 04/11/2017 CLINICAL DATA:  Sigmoid colonic diverticular abscess and status post percutaneous drainage catheter placement on 04/01/2017. EXAM: CT ABDOMEN AND PELVIS WITH CONTRAST TECHNIQUE: Multidetector CT imaging of the abdomen and pelvis was performed using the standard protocol following bolus administration of intravenous contrast. CONTRAST:  ISOVUE-300 IOPAMIDOL (ISOVUE-300) INJECTION 61% COMPARISON:  03/31/2017 FINDINGS: Lower chest: Trace amount of left pleural fluid.  No consolidation. Hepatobiliary: No focal liver abnormality is seen. No gallstones, gallbladder wall thickening, or biliary dilatation. Pancreas: Unremarkable. No pancreatic ductal dilatation or surrounding inflammatory changes. Spleen: Normal in size without focal abnormality. Adrenals/Urinary Tract: Adrenal glands are unremarkable. Kidneys are normal, without renal calculi, focal lesion, or hydronephrosis. Bladder is unremarkable. Stomach/Bowel: No evidence of bowel obstruction. Diverticular abscess has essential resolved with no further fluid identified. A small amount of loculated extraluminal air remains inferior and lateral to the pigtail portion of the drainage catheter. No evidence of acute inflammation. Vascular/Lymphatic: No significant vascular findings are present. No enlarged abdominal or pelvic lymph nodes. Reproductive: Uterus and bilateral adnexa are unremarkable. Other: No abdominal wall hernia or abnormality. No abdominopelvic ascites. Musculoskeletal: No acute or significant osseous findings. IMPRESSION: Resolution of sigmoid diverticular abscess after percutaneous catheter drainage with small amount of loculated air remaining just inferior and lateral to the  pigtail portion of the drainage catheter. Electronically Signed   By: Irish Lack M.D.   On: 04/11/2017 11:13   Ct Abdomen Pelvis W Contrast  Result Date: 03/31/2017 CLINICAL DATA:  Low abdominal pain for 2 weeks. Clinical suspicion for diverticulitis. EXAM: CT ABDOMEN AND PELVIS WITH CONTRAST TECHNIQUE: Multidetector CT imaging of the abdomen and pelvis was performed using the standard protocol following bolus administration of intravenous contrast. CONTRAST:  ISOVUE-300 IOPAMIDOL (ISOVUE-300) INJECTION 61% COMPARISON:  None. FINDINGS: Lower chest:  Unremarkable. Hepatobiliary: 1.7 cm low-density lesion posterior right liver has attenuation higher than would be expected for a simple cyst. Liver parenchyma otherwise unremarkable. There is no evidence for gallstones, gallbladder wall thickening, or pericholecystic fluid. No intrahepatic or extrahepatic biliary dilation. Pancreas: No focal mass lesion. No dilatation of the main duct. No intraparenchymal cyst. No peripancreatic edema. Spleen: No splenomegaly. No focal mass lesion. Adrenals/Urinary Tract: No adrenal nodule or mass. Right kidney unremarkable with prominent extrarenal pelvis. Right ureter within normal limits. The left kidney shows mild hydronephrosis and decreased perfusion compatible with obstructive uropathy. Source of urinary obstruction is the left pelvic sidewall inflammatory process (see below). Stomach/Bowel: Stomach is nondistended. No gastric wall thickening. No evidence of outlet obstruction. Duodenum is normally positioned as is the ligament of Treitz. No small bowel wall thickening. No small bowel dilatation. The terminal ileum is normal. The appendix is normal. No colonic  dilatation. The sigmoid colon shows irregular rim wall thickening and there is a large para colonic collection of gas and air tracking up along the left pelvic sidewall. This measures 5.0 x 12.6 x 7.1 cm and is compatible with a a large para colonic abscess,  likely of diverticular origin. This abscess is the cause of the left urinary obstruction and generates mass-effect on the left external iliac vessels, but the left external iliac and common iliac veins do appear to remain patent. Vascular/Lymphatic: There is abdominal aortic atherosclerosis without aneurysm. There is no gastrohepatic or hepatoduodenal ligament lymphadenopathy. No intraperitoneal or retroperitoneal lymphadenopathy. No pelvic sidewall lymphadenopathy. Reproductive: The uterus has normal CT imaging appearance. No right adnexal mass. Left ovary appears to be incorporated in the inflammatory process along the left pelvic sidewall. Other: Trace intraperitoneal free fluid evident. Musculoskeletal: Bone windows reveal no worrisome lytic or sclerotic osseous lesions. IMPRESSION: 1. 5 x 13 x 7 cm abscess along the left pelvic sidewall appears to be secondary to sigmoid colon diverticulitis. Left ovary may be incorporated into the inflammatory process. 2. Left obstructive uropathy. The level of obstruction is at the may be ureter and secondary to the abscess along the left pelvic sidewall. 3. Insert athero Electronically Signed   By: Kennith Center M.D.   On: 03/31/2017 12:51   Ct Image Guided Drainage By Percutaneous Catheter  Result Date: 04/01/2017 INDICATION: 66 year old female with a history of diverticular abscess peer EXAM: CT GUIDED DRAINAGE OF  ABSCESS MEDICATIONS: The patient is currently admitted to the hospital and receiving intravenous antibiotics. The antibiotics were administered within an appropriate time frame prior to the initiation of the procedure. ANESTHESIA/SEDATION: 3.0 mg IV Versed 75 mcg IV Fentanyl Moderate Sedation Time:  14 minutes The patient was continuously monitored during the procedure by the interventional radiology nurse under my direct supervision. COMPLICATIONS: None TECHNIQUE: Informed written consent was obtained from the patient after a thorough discussion of the  procedural risks, benefits and alternatives. All questions were addressed. Maximal Sterile Barrier Technique was utilized including caps, mask, sterile gowns, sterile gloves, sterile drape, hand hygiene and skin antiseptic. A timeout was performed prior to the initiation of the procedure. PROCEDURE: The operative field was prepped with chlorhexidine in a sterile fashion, and a sterile drape was applied covering the operative field. A sterile gown and sterile gloves were used for the procedure. Local anesthesia was provided with 1% Lidocaine. Once the patient is prepped and draped in the usual sterile fashion, the skin and subcutaneous tissues of the anterior abdominal wall were generously infiltrated with 1% lidocaine for local anesthesia. Using CT guidance, 18 gauge trocar needle was advanced into the abscess within the left pelvis. Once we confirmed the needle tip was in position with CT guidance and with aspiration of purulent material, modified Seldinger technique was used to place a 10 French drainage catheter into the abscess. Approximately 20- 30 cc of purulent material aspirated for culture. Catheter was sutured to the anterior abdominal wall and a final image was stored. Catheter was attached to gravity drainage. Patient tolerated the procedure well and remained hemodynamically stable throughout. No complications were encountered and no significant blood loss. FINDINGS: Initial CT imaged re- demonstrates gas and fluid collection in the left pelvis compatible with diverticular abscess. Status post drainage catheter placement there is collapse of the fluid collection. IMPRESSION: Status post CT-guided drainage of left pelvic abscess. Sample was sent for culture. Signed, Yvone Neu. Loreta Ave, DO [facility] Electronically  Signed   By: Gilmer Mor D.O.   On: 04/01/2017 17:43    Labs:  CBC:  Recent Labs  03/31/17 0823  WBC 16.3*  HGB 11.3*  HCT  34.7*  PLT 622*    COAGS:  Recent Labs  03/31/17 1544  INR 1.21  APTT 31    BMP:  Recent Labs  03/31/17 0823 04/03/17 1021  NA 136 142  K 3.2* 3.3*  CL 103 110  CO2 23 25  GLUCOSE 127* 118*  BUN 10 <5*  CALCIUM 8.8* 8.5*  CREATININE 0.84 0.72  GFRNONAA >60 >60  GFRAA >60 >60    LIVER FUNCTION TESTS:  Recent Labs  03/31/17 0823 04/03/17 1021  BILITOT 0.7 0.4  AST 18 31  ALT 14 18  ALKPHOS 84 63  PROT 6.6 5.8*  ALBUMIN 3.0* 2.4*    Assessment and Plan:  CT performed earlier today demonstrates decompression of the large sigmoid diverticular abscess after catheter drainage with small amount pocket of air present just inferior to the drain. No further significant inflammation is seen surrounding the adjacent sigmoid colon. Contrast injection of the drain was also performed under fluoroscopy demonstrating no evidence of fistula to bowel. The percutaneous drain was removed.  I recommended that Mrs. Zimbelman keep her surgical follow-up appointment next week. I also recommended that she seek care of a gastroenterologist and general surgeon when she moves to [place] given that she is at moderately high risk of recurrent diverticulitis.  Electronically SignedIrish Lack T 04/11/2017, 3:06 PM   I spent a total of 15 Minutes in face to face in clinical consultation, greater than 50% of which was counseling/coordinating care for sigmoid diverticulitis with abscess.

## 2017-05-29 ENCOUNTER — Encounter: Payer: Self-pay | Admitting: Interventional Radiology
